# Patient Record
Sex: Male | Born: 1940 | Race: White | Hispanic: No | Marital: Married | Smoking: Former smoker
Health system: Southern US, Community
[De-identification: ages and names within clinical notes are randomized; demographics above are authoritative.]

## PROBLEM LIST (undated history)

## (undated) DIAGNOSIS — H43399 Other vitreous opacities, unspecified eye: Secondary | ICD-10-CM

## (undated) DIAGNOSIS — E785 Hyperlipidemia, unspecified: Secondary | ICD-10-CM

## (undated) DIAGNOSIS — M549 Dorsalgia, unspecified: Secondary | ICD-10-CM

## (undated) DIAGNOSIS — I10 Essential (primary) hypertension: Secondary | ICD-10-CM

## (undated) DIAGNOSIS — Z860100 Personal history of colon polyps, unspecified: Secondary | ICD-10-CM

## (undated) HISTORY — PX: OTHER SURGICAL HISTORY: SHX169

## (undated) HISTORY — PX: UPPER GASTROINTESTINAL ENDOSCOPY: SHX188

## (undated) HISTORY — PX: KNEE SURGERY: SHX244

## (undated) HISTORY — DX: Hyperlipidemia, unspecified: E78.5

## (undated) HISTORY — DX: Other vitreous opacities, unspecified eye: H43.399

## (undated) HISTORY — PX: SIGMOIDOSCOPY: SUR1295

## (undated) HISTORY — PX: COLONOSCOPY: SHX174

## (undated) HISTORY — PX: KNEE CARTILAGE SURGERY: SHX688

## (undated) HISTORY — DX: Dorsalgia, unspecified: M54.9

## (undated) HISTORY — DX: Essential (primary) hypertension: I10

## (undated) HISTORY — DX: Personal history of colon polyps, unspecified: Z86.0100

## (undated) HISTORY — PX: TONSILLECTOMY AND ADENOIDECTOMY: SHX28

## (undated) HISTORY — PX: CHOLECYSTECTOMY: SHX55

## (undated) HISTORY — PX: LUMBAR DISC SURGERY: SHX700

## (undated) HISTORY — DX: Personal history of colonic polyps: Z86.010

---

## 2004-09-11 ENCOUNTER — Ambulatory Visit: Payer: Self-pay | Admitting: Internal Medicine

## 2004-10-23 ENCOUNTER — Ambulatory Visit: Payer: Self-pay | Admitting: Internal Medicine

## 2004-10-31 ENCOUNTER — Ambulatory Visit: Payer: Self-pay | Admitting: Internal Medicine

## 2004-12-29 ENCOUNTER — Ambulatory Visit: Payer: Self-pay | Admitting: Internal Medicine

## 2005-02-26 ENCOUNTER — Ambulatory Visit: Payer: Self-pay | Admitting: Internal Medicine

## 2005-03-05 ENCOUNTER — Ambulatory Visit: Payer: Self-pay | Admitting: Internal Medicine

## 2005-07-13 ENCOUNTER — Ambulatory Visit: Payer: Self-pay | Admitting: Internal Medicine

## 2005-08-29 ENCOUNTER — Ambulatory Visit: Payer: Self-pay | Admitting: Internal Medicine

## 2005-09-12 ENCOUNTER — Ambulatory Visit: Payer: Self-pay | Admitting: Internal Medicine

## 2006-02-26 ENCOUNTER — Ambulatory Visit: Payer: Self-pay | Admitting: Internal Medicine

## 2006-02-26 LAB — CONVERTED CEMR LAB
ALT: 18 units/L (ref 0–40)
AST: 18 units/L (ref 0–37)
Albumin: 3.5 g/dL (ref 3.5–5.2)
Alkaline Phosphatase: 55 units/L (ref 39–117)
BUN: 15 mg/dL (ref 6–23)
Basophils Absolute: 0 10*3/uL (ref 0.0–0.1)
Basophils Relative: 0.5 % (ref 0.0–1.0)
CO2: 29 meq/L (ref 19–32)
Calcium: 9 mg/dL (ref 8.4–10.5)
Chloride: 112 meq/L (ref 96–112)
Chol/HDL Ratio, serum: 3.3
Cholesterol: 155 mg/dL (ref 0–200)
Creatinine, Ser: 0.9 mg/dL (ref 0.4–1.5)
Eosinophil percent: 2.4 % (ref 0.0–5.0)
GFR calc non Af Amer: 89 mL/min
Glomerular Filtration Rate, Af Am: 108 mL/min/{1.73_m2}
Glucose, Bld: 107 mg/dL — ABNORMAL HIGH (ref 70–99)
HCT: 42.2 % (ref 39.0–52.0)
HDL: 46.8 mg/dL (ref >39.0)
Hemoglobin: 13.8 g/dL (ref 13.0–17.0)
LDL Cholesterol: 95 mg/dL (ref 0–99)
Lymphocytes Relative: 23.3 % (ref 12.0–46.0)
MCHC: 32.8 g/dL (ref 30.0–36.0)
MCV: 85 fL (ref 78.0–100.0)
Monocytes Absolute: 0.4 10*3/uL (ref 0.2–0.7)
Monocytes Relative: 6.9 % (ref 3.0–11.0)
Neutro Abs: 4.2 10*3/uL (ref 1.4–7.7)
Neutrophils Relative %: 66.9 % (ref 43.0–77.0)
PSA: 1.15 ng/mL (ref 0.10–4.00)
Platelets: 175 10*3/uL (ref 150–400)
Potassium: 4.3 meq/L (ref 3.5–5.1)
RBC: 4.97 M/uL (ref 4.22–5.81)
RDW: 12.3 % (ref 11.5–14.6)
Sodium: 145 meq/L (ref 135–145)
TSH: 1.13 microintl units/mL (ref 0.35–5.50)
Total Bilirubin: 0.8 mg/dL (ref 0.3–1.2)
Total Protein: 6.3 g/dL (ref 6.0–8.3)
Triglyceride fasting, serum: 66 mg/dL (ref 0–149)
VLDL: 13 mg/dL (ref 0–40)
WBC: 6.1 10*3/uL (ref 4.5–10.5)

## 2006-03-04 ENCOUNTER — Ambulatory Visit: Payer: Self-pay | Admitting: Internal Medicine

## 2006-03-12 ENCOUNTER — Ambulatory Visit: Payer: Self-pay | Admitting: Internal Medicine

## 2006-03-24 ENCOUNTER — Ambulatory Visit: Payer: Self-pay | Admitting: Internal Medicine

## 2006-03-24 ENCOUNTER — Encounter: Payer: Self-pay | Admitting: Internal Medicine

## 2006-04-23 ENCOUNTER — Encounter: Payer: Self-pay | Admitting: Internal Medicine

## 2006-04-26 LAB — HM COLONOSCOPY

## 2006-04-29 ENCOUNTER — Ambulatory Visit: Payer: Self-pay | Admitting: Internal Medicine

## 2006-05-05 ENCOUNTER — Encounter (INDEPENDENT_AMBULATORY_CARE_PROVIDER_SITE_OTHER): Payer: Self-pay | Admitting: Specialist

## 2006-05-05 ENCOUNTER — Ambulatory Visit: Payer: Self-pay | Admitting: Internal Medicine

## 2006-05-05 ENCOUNTER — Encounter: Payer: Self-pay | Admitting: Internal Medicine

## 2006-05-15 ENCOUNTER — Ambulatory Visit: Payer: Self-pay | Admitting: Internal Medicine

## 2006-06-04 ENCOUNTER — Ambulatory Visit: Payer: Self-pay | Admitting: Internal Medicine

## 2006-06-10 ENCOUNTER — Encounter: Admission: RE | Admit: 2006-06-10 | Discharge: 2006-06-10 | Payer: Self-pay | Admitting: Internal Medicine

## 2006-06-19 ENCOUNTER — Ambulatory Visit: Payer: Self-pay | Admitting: Internal Medicine

## 2006-09-02 ENCOUNTER — Ambulatory Visit: Payer: Self-pay | Admitting: Internal Medicine

## 2006-09-02 LAB — CONVERTED CEMR LAB
ALT: 13 units/L (ref 0–53)
AST: 19 units/L (ref 0–37)
Albumin: 3.6 g/dL (ref 3.5–5.2)
Alkaline Phosphatase: 54 units/L (ref 39–117)
BUN: 15 mg/dL (ref 6–23)
Bilirubin, Direct: 0.1 mg/dL (ref 0.0–0.3)
CO2: 29 meq/L (ref 19–32)
Calcium: 9.1 mg/dL (ref 8.4–10.5)
Chloride: 111 meq/L (ref 96–112)
Cholesterol: 157 mg/dL (ref 0–200)
Creatinine, Ser: 0.9 mg/dL (ref 0.4–1.5)
GFR calc Af Amer: 108 mL/min
GFR calc non Af Amer: 89 mL/min
Glucose, Bld: 102 mg/dL — ABNORMAL HIGH (ref 70–99)
HDL: 52.4 mg/dL (ref >39.0)
Hgb A1c MFr Bld: 5.5 % (ref 4.6–6.0)
LDL Cholesterol: 89 mg/dL (ref 0–99)
Potassium: 4.8 meq/L (ref 3.5–5.1)
Sodium: 143 meq/L (ref 135–145)
Total Bilirubin: 0.9 mg/dL (ref 0.3–1.2)
Total CHOL/HDL Ratio: 3
Total Protein: 6.5 g/dL (ref 6.0–8.3)
Triglycerides: 76 mg/dL (ref 0–149)
VLDL: 15 mg/dL (ref 0–40)

## 2006-09-05 ENCOUNTER — Ambulatory Visit: Payer: Self-pay | Admitting: Internal Medicine

## 2006-12-08 ENCOUNTER — Telehealth: Payer: Self-pay | Admitting: Internal Medicine

## 2006-12-22 DIAGNOSIS — Z860100 Personal history of colon polyps, unspecified: Secondary | ICD-10-CM | POA: Insufficient documentation

## 2006-12-22 DIAGNOSIS — I10 Essential (primary) hypertension: Secondary | ICD-10-CM | POA: Insufficient documentation

## 2006-12-22 DIAGNOSIS — K219 Gastro-esophageal reflux disease without esophagitis: Secondary | ICD-10-CM

## 2006-12-22 DIAGNOSIS — E785 Hyperlipidemia, unspecified: Secondary | ICD-10-CM | POA: Insufficient documentation

## 2007-01-26 ENCOUNTER — Observation Stay (HOSPITAL_COMMUNITY): Admission: EM | Admit: 2007-01-26 | Discharge: 2007-01-27 | Payer: Self-pay | Admitting: Emergency Medicine

## 2007-01-26 ENCOUNTER — Ambulatory Visit: Payer: Self-pay | Admitting: Internal Medicine

## 2007-01-26 ENCOUNTER — Telehealth: Payer: Self-pay | Admitting: Internal Medicine

## 2007-01-27 ENCOUNTER — Telehealth (INDEPENDENT_AMBULATORY_CARE_PROVIDER_SITE_OTHER): Payer: Self-pay | Admitting: *Deleted

## 2007-02-04 ENCOUNTER — Ambulatory Visit: Payer: Self-pay

## 2007-02-04 ENCOUNTER — Encounter: Payer: Self-pay | Admitting: Internal Medicine

## 2007-02-10 ENCOUNTER — Telehealth: Payer: Self-pay | Admitting: Internal Medicine

## 2007-03-05 ENCOUNTER — Telehealth: Payer: Self-pay | Admitting: Internal Medicine

## 2007-03-10 ENCOUNTER — Ambulatory Visit: Payer: Self-pay | Admitting: Internal Medicine

## 2007-03-10 LAB — CONVERTED CEMR LAB
ALT: 22 units/L (ref 0–53)
AST: 18 units/L (ref 0–37)
Albumin: 3.7 g/dL (ref 3.5–5.2)
Alkaline Phosphatase: 62 units/L (ref 39–117)
BUN: 17 mg/dL (ref 6–23)
Basophils Relative: 0.6 % (ref 0.0–1.0)
Bilirubin Urine: NEGATIVE
Bilirubin, Direct: 0.2 mg/dL (ref 0.0–0.3)
Blood in Urine, dipstick: NEGATIVE
CO2: 28 meq/L (ref 19–32)
Calcium: 9.2 mg/dL (ref 8.4–10.5)
Chloride: 106 meq/L (ref 96–112)
GFR calc Af Amer: 96 mL/min
Hemoglobin: 15.1 g/dL (ref 13.0–17.0)
Ketones, urine, test strip: NEGATIVE
MCHC: 34.5 g/dL (ref 30.0–36.0)
Monocytes Relative: 9.1 % (ref 3.0–11.0)
Neutro Abs: 3.3 10*3/uL (ref 1.4–7.7)
Neutrophils Relative %: 56.5 % (ref 43.0–77.0)
Platelets: 162 10*3/uL (ref 150–400)
Protein, U semiquant: NEGATIVE
RBC: 5.03 M/uL (ref 4.22–5.81)
RDW: 12.4 % (ref 11.5–14.6)
Sodium: 142 meq/L (ref 135–145)
Specific Gravity, Urine: 1.025
Total Bilirubin: 0.9 mg/dL (ref 0.3–1.2)
Total CHOL/HDL Ratio: 3.8
Total Protein: 6.5 g/dL (ref 6.0–8.3)
VLDL: 14 mg/dL (ref 0–40)
WBC Urine, dipstick: NEGATIVE

## 2007-03-17 ENCOUNTER — Ambulatory Visit: Payer: Self-pay | Admitting: Internal Medicine

## 2007-09-09 ENCOUNTER — Ambulatory Visit: Payer: Self-pay | Admitting: Internal Medicine

## 2007-09-09 LAB — CONVERTED CEMR LAB
Albumin: 3.9 g/dL (ref 3.5–5.2)
Bilirubin, Direct: 0.1 mg/dL (ref 0.0–0.3)
Calcium: 9.3 mg/dL (ref 8.4–10.5)
Chloride: 108 meq/L (ref 96–112)
Creatinine, Ser: 0.8 mg/dL (ref 0.4–1.5)
GFR calc non Af Amer: 102 mL/min
Sodium: 142 meq/L (ref 135–145)
Total CHOL/HDL Ratio: 3.5

## 2007-09-22 ENCOUNTER — Ambulatory Visit: Payer: Self-pay | Admitting: Internal Medicine

## 2008-03-18 ENCOUNTER — Ambulatory Visit: Payer: Self-pay | Admitting: Internal Medicine

## 2008-03-18 LAB — CONVERTED CEMR LAB
ALT: 23 units/L (ref 0–53)
AST: 20 units/L (ref 0–37)
Albumin: 3.9 g/dL (ref 3.5–5.2)
Alkaline Phosphatase: 53 units/L (ref 39–117)
BUN: 21 mg/dL (ref 6–23)
Basophils Absolute: 0 10*3/uL (ref 0.0–0.1)
Bilirubin Urine: NEGATIVE
Blood in Urine, dipstick: NEGATIVE
CO2: 29 meq/L (ref 19–32)
Calcium: 9.4 mg/dL (ref 8.4–10.5)
Cholesterol: 169 mg/dL (ref 0–200)
Eosinophils Absolute: 0.1 10*3/uL (ref 0.0–0.7)
HCT: 44.8 % (ref 39.0–52.0)
LDL Cholesterol: 102 mg/dL — ABNORMAL HIGH (ref 0–99)
Lymphocytes Relative: 26 % (ref 12.0–46.0)
MCHC: 34.1 g/dL (ref 30.0–36.0)
MCV: 87.4 fL (ref 78.0–100.0)
Neutro Abs: 4.4 10*3/uL (ref 1.4–7.7)
Nitrite: NEGATIVE
Platelets: 168 10*3/uL (ref 150–400)
Potassium: 4.6 meq/L (ref 3.5–5.1)
RBC: 5.13 M/uL (ref 4.22–5.81)
Sodium: 141 meq/L (ref 135–145)
Specific Gravity, Urine: 1.02
Triglycerides: 94 mg/dL (ref 0–149)
Urobilinogen, UA: 0.2
VLDL: 19 mg/dL (ref 0–40)
WBC Urine, dipstick: NEGATIVE

## 2008-03-24 ENCOUNTER — Ambulatory Visit: Payer: Self-pay | Admitting: Internal Medicine

## 2008-05-13 ENCOUNTER — Ambulatory Visit (HOSPITAL_COMMUNITY): Admission: RE | Admit: 2008-05-13 | Discharge: 2008-05-13 | Payer: Self-pay | Admitting: Orthopaedic Surgery

## 2008-06-21 ENCOUNTER — Encounter: Payer: Self-pay | Admitting: Endocrinology

## 2008-09-15 ENCOUNTER — Ambulatory Visit: Payer: Self-pay | Admitting: Internal Medicine

## 2008-09-15 LAB — CONVERTED CEMR LAB
ALT: 15 units/L (ref 0–53)
AST: 18 units/L (ref 0–37)
Bilirubin, Direct: 0 mg/dL (ref 0.0–0.3)
Cholesterol: 154 mg/dL (ref 0–200)
Creatinine, Ser: 1 mg/dL (ref 0.4–1.5)
GFR calc non Af Amer: 78.47 mL/min (ref >60)
Glucose, Bld: 110 mg/dL — ABNORMAL HIGH (ref 70–99)
LDL Cholesterol: 88 mg/dL (ref 0–99)
Potassium: 4.2 meq/L (ref 3.5–5.1)
Total CHOL/HDL Ratio: 3
Total Protein: 6.7 g/dL (ref 6.0–8.3)
VLDL: 14.6 mg/dL (ref 0.0–40.0)

## 2008-09-22 ENCOUNTER — Ambulatory Visit: Payer: Self-pay | Admitting: Internal Medicine

## 2009-02-13 ENCOUNTER — Ambulatory Visit: Payer: Self-pay | Admitting: Internal Medicine

## 2009-02-15 IMAGING — CT CT ANGIO CHEST
1 of 4 series · 19 of 36 positions shown · IV contrast (APPLIED)
Comparison: Chest radiograph same day.

CLINICAL DATA: Short of breath.  Chest pain.  Assess for pulmonary emboli.  
 CT ANGIOGRAPHY OF CHEST:
TECHNIQUE: Multidetector CT imaging of the chest was performed during bolus injection of intravenous contrast.  Multiplanar CT angiographic image reconstructions were generated to evaluate the vascular anatomy.
 Contrast:  100 cc Omnipaque 300

[Series 6: pe 1.0 b40f thins for pacs · axial · 0.68mm/px · z∈[-258,-20]mm · 19 of 266 slices shown]
[im 14/266  lung]
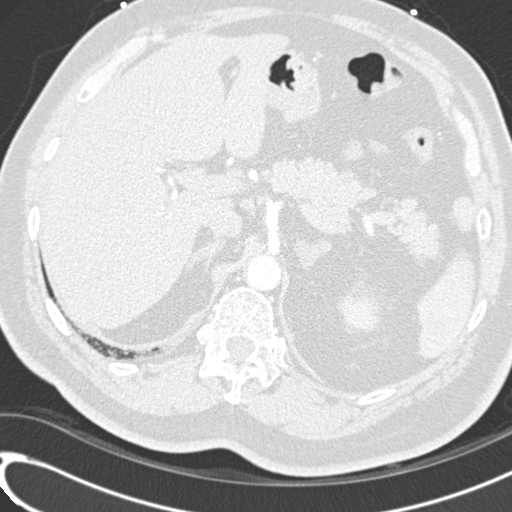
[im 27/266  mediastinal]
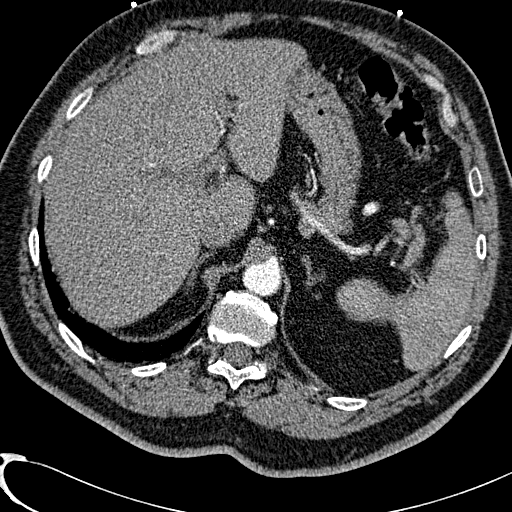
[im 40/266  lung]
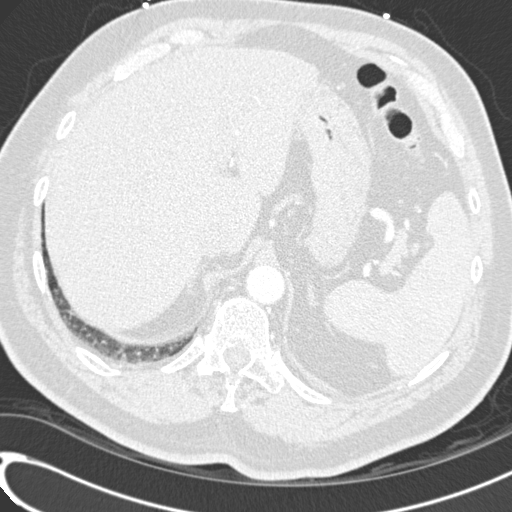
[im 54/266  mediastinal]
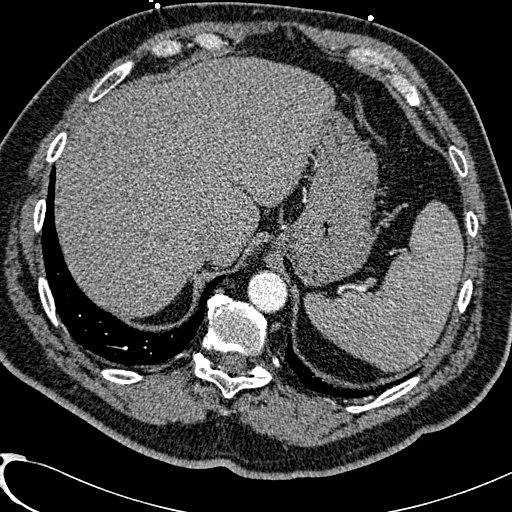
[im 67/266  lung]
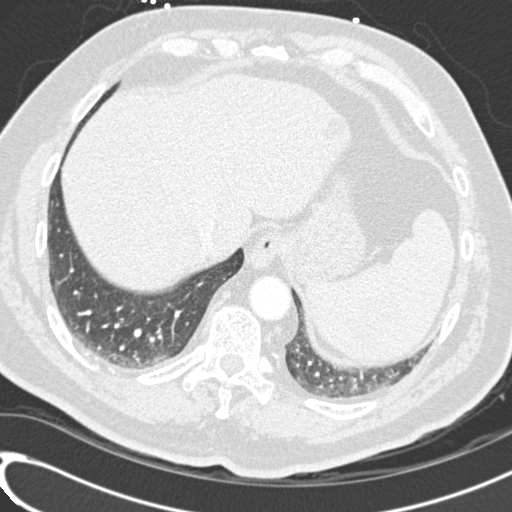
[im 80/266  mediastinal]
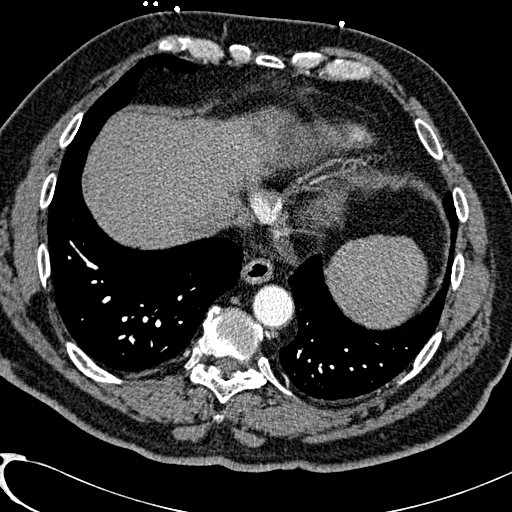
[im 93/266  lung]
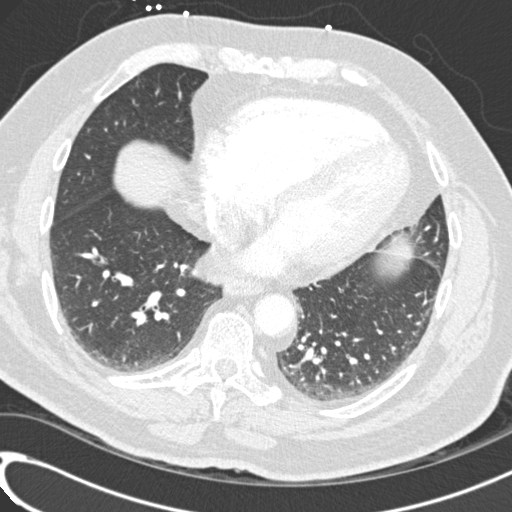
[im 107/266  mediastinal]
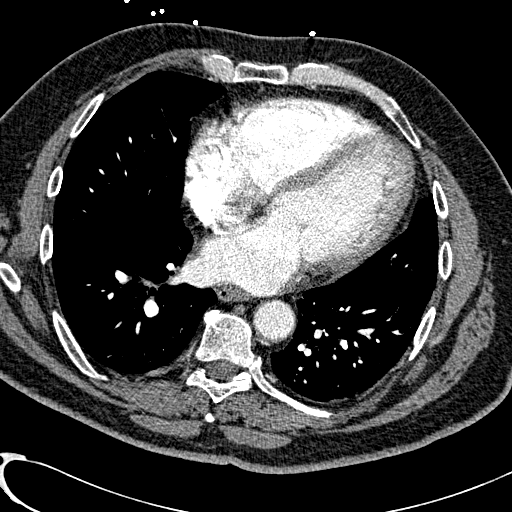
[im 120/266  lung]
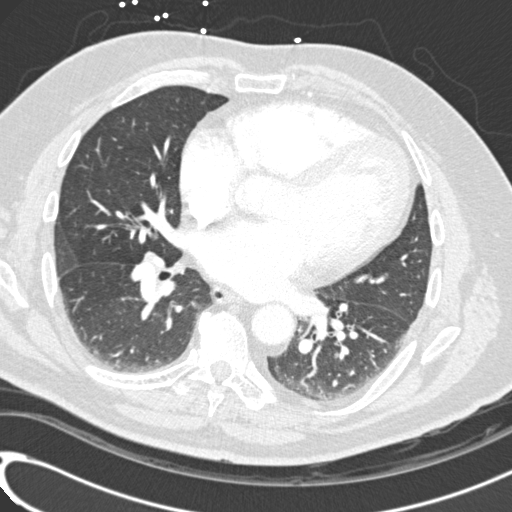
[im 133/266  mediastinal]
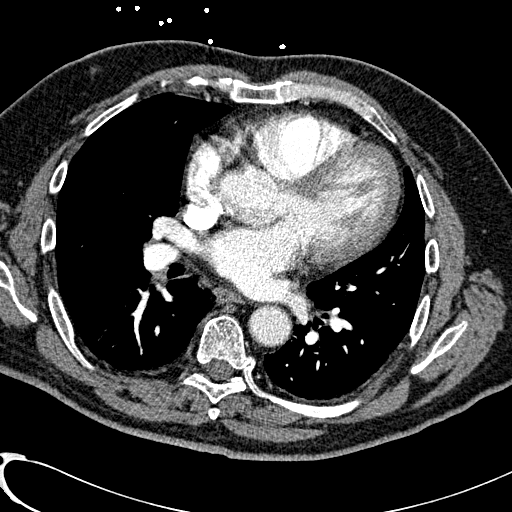
[im 146/266  lung]
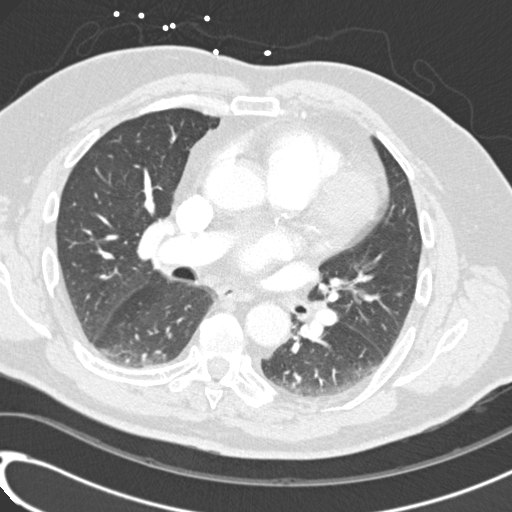
[im 160/266  mediastinal]
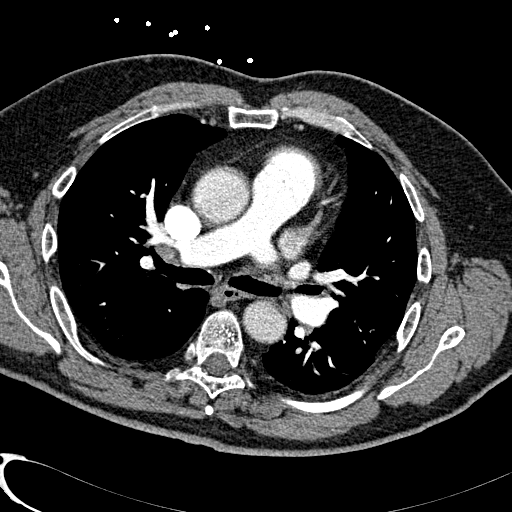
[im 173/266  lung]
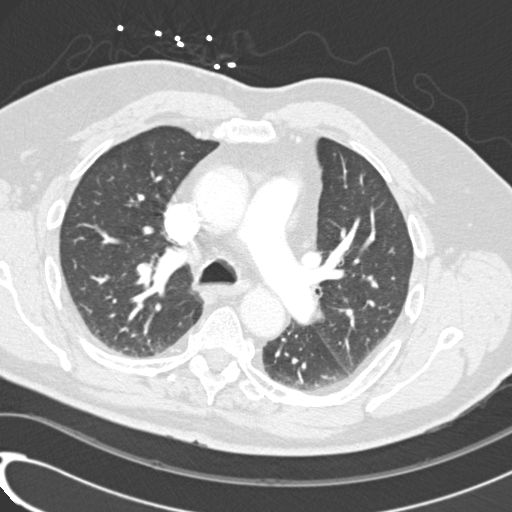
[im 186/266  mediastinal]
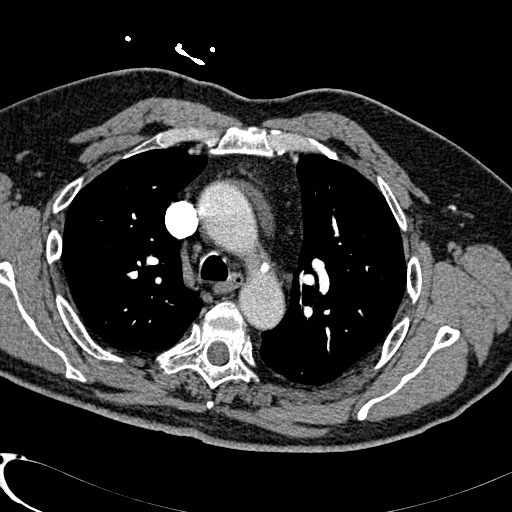
[im 199/266  lung]
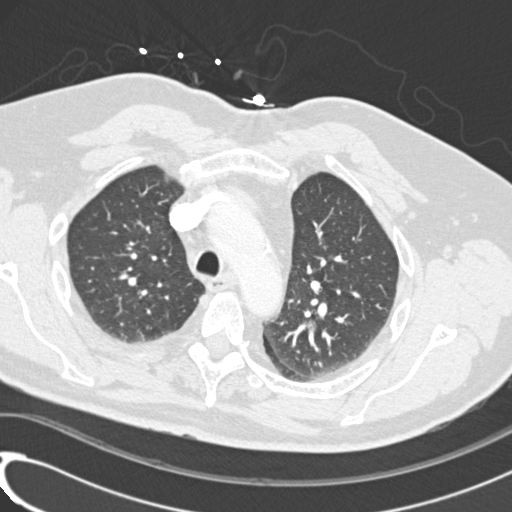
[im 213/266  mediastinal]
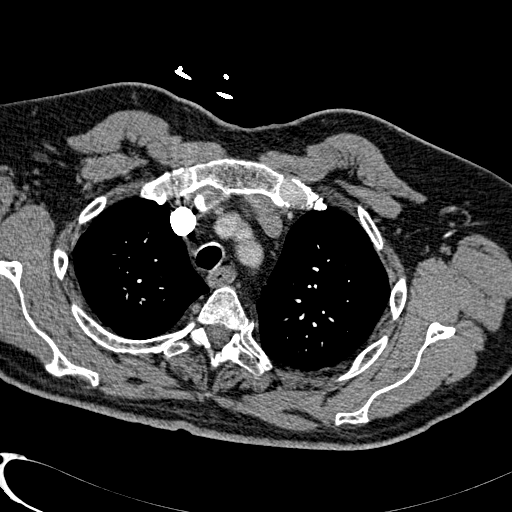
[im 226/266  lung]
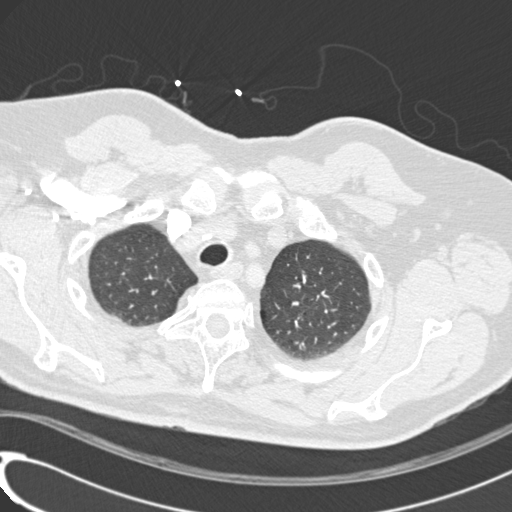
[im 239/266  mediastinal]
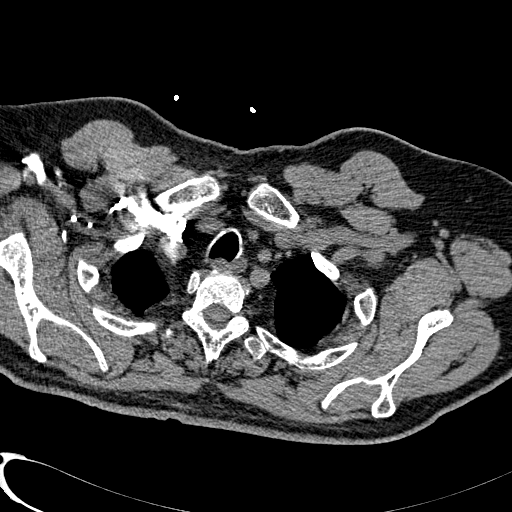
[im 252/266  lung]
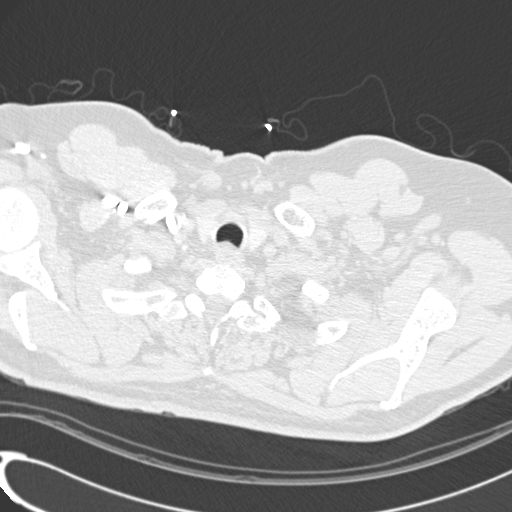

[19 of 36 positions shown; findings below may reference images not displayed]

FINDINGS: There are no pulmonary emboli.  There is atherosclerosis of the aorta but no sign of dissection.  There is no pleural or pericardial fluid.  The lung parenchyma is clear.  No mediastinal or hilar adenopathy.  Scans in the upper abdomen show two 1 cm nonspecific hypodense foci in the lateral segment of the left lobe of the liver.  These are not completely evaluated and could be benign or malignant.  Statistically more likely the former.  No significant spinal lesion is evident.
IMPRESSION: 1.   No pulmonary emboli. 
 2.  No active pulmonary pathology evident. 
 3.  Two low densities in the left lobe of the liver not completely evaluated.  These are nonspecific and are statistically likely to represent benign entities but metastatic disease to the liver cannot be excluded though there is no strong likelihood of that.

## 2009-02-17 ENCOUNTER — Emergency Department (HOSPITAL_COMMUNITY): Admission: EM | Admit: 2009-02-17 | Discharge: 2009-02-17 | Payer: Self-pay | Admitting: Family Medicine

## 2009-03-09 ENCOUNTER — Encounter (INDEPENDENT_AMBULATORY_CARE_PROVIDER_SITE_OTHER): Payer: Self-pay | Admitting: Orthopaedic Surgery

## 2009-03-09 ENCOUNTER — Ambulatory Visit: Payer: Self-pay | Admitting: Surgery

## 2009-03-09 ENCOUNTER — Ambulatory Visit: Admission: RE | Admit: 2009-03-09 | Discharge: 2009-03-09 | Payer: Self-pay | Admitting: Orthopaedic Surgery

## 2009-03-09 ENCOUNTER — Ambulatory Visit: Payer: Self-pay | Admitting: Internal Medicine

## 2009-03-09 LAB — CONVERTED CEMR LAB
Albumin: 3.8 g/dL (ref 3.5–5.2)
Alkaline Phosphatase: 58 units/L (ref 39–117)
Bilirubin, Direct: 0.1 mg/dL (ref 0.0–0.3)
Chloride: 103 meq/L (ref 96–112)
Glucose, Bld: 101 mg/dL — ABNORMAL HIGH (ref 70–99)
Total CHOL/HDL Ratio: 3
Total Protein: 7 g/dL (ref 6.0–8.3)
VLDL: 13.8 mg/dL (ref 0.0–40.0)

## 2009-03-20 ENCOUNTER — Ambulatory Visit: Payer: Self-pay | Admitting: Internal Medicine

## 2009-04-24 ENCOUNTER — Ambulatory Visit: Payer: Self-pay | Admitting: Family Medicine

## 2009-08-30 ENCOUNTER — Telehealth: Payer: Self-pay | Admitting: Internal Medicine

## 2009-09-11 ENCOUNTER — Ambulatory Visit: Payer: Self-pay | Admitting: Internal Medicine

## 2009-09-11 LAB — CONVERTED CEMR LAB
Albumin: 3.9 g/dL (ref 3.5–5.2)
Alkaline Phosphatase: 54 units/L (ref 39–117)
Bilirubin, Direct: 0.1 mg/dL (ref 0.0–0.3)
CO2: 30 meq/L (ref 19–32)
Calcium: 8.8 mg/dL (ref 8.4–10.5)
Cholesterol: 175 mg/dL (ref 0–200)
Creatinine, Ser: 0.9 mg/dL (ref 0.4–1.5)
GFR calc non Af Amer: 93.12 mL/min (ref >60)
LDL Cholesterol: 100 mg/dL — ABNORMAL HIGH (ref 0–99)
Sodium: 144 meq/L (ref 135–145)
Total CHOL/HDL Ratio: 3
VLDL: 17.8 mg/dL (ref 0.0–40.0)

## 2009-09-18 ENCOUNTER — Ambulatory Visit: Payer: Self-pay | Admitting: Internal Medicine

## 2009-10-20 ENCOUNTER — Encounter: Payer: Self-pay | Admitting: Internal Medicine

## 2009-11-16 ENCOUNTER — Ambulatory Visit: Payer: Self-pay | Admitting: Internal Medicine

## 2009-12-16 ENCOUNTER — Emergency Department (HOSPITAL_COMMUNITY): Admission: EM | Admit: 2009-12-16 | Discharge: 2009-12-16 | Payer: Self-pay | Admitting: Family Medicine

## 2010-02-25 HISTORY — PX: CATARACT EXTRACTION, BILATERAL: SHX1313

## 2010-03-12 ENCOUNTER — Ambulatory Visit
Admission: RE | Admit: 2010-03-12 | Discharge: 2010-03-12 | Payer: Self-pay | Source: Home / Self Care | Attending: Internal Medicine | Admitting: Internal Medicine

## 2010-03-12 ENCOUNTER — Other Ambulatory Visit: Payer: Self-pay | Admitting: Internal Medicine

## 2010-03-12 LAB — BASIC METABOLIC PANEL
BUN: 19 mg/dL (ref 6–23)
CO2: 25 mEq/L (ref 19–32)
Calcium: 8.8 mg/dL (ref 8.4–10.5)
Chloride: 104 mEq/L (ref 96–112)
Creatinine, Ser: 0.9 mg/dL (ref 0.4–1.5)
GFR: 94.25 mL/min (ref >60.00)
Glucose, Bld: 99 mg/dL (ref 70–99)
Potassium: 4.2 mEq/L (ref 3.5–5.1)
Sodium: 137 mEq/L (ref 135–145)

## 2010-03-12 LAB — HEPATIC FUNCTION PANEL
ALT: 18 U/L (ref 0–53)
AST: 20 U/L (ref 0–37)
Albumin: 3.9 g/dL (ref 3.5–5.2)
Alkaline Phosphatase: 57 U/L (ref 39–117)
Bilirubin, Direct: 0.1 mg/dL (ref 0.0–0.3)
Total Bilirubin: 0.8 mg/dL (ref 0.3–1.2)
Total Protein: 6.6 g/dL (ref 6.0–8.3)

## 2010-03-12 LAB — LIPID PANEL
Cholesterol: 158 mg/dL (ref 0–200)
HDL: 53.9 mg/dL (ref >39.00)
LDL Cholesterol: 86 mg/dL (ref 0–99)
Total CHOL/HDL Ratio: 3
Triglycerides: 92 mg/dL (ref 0.0–149.0)
VLDL: 18.4 mg/dL (ref 0.0–40.0)

## 2010-03-12 LAB — CBC WITH DIFFERENTIAL/PLATELET
Basophils Absolute: 0 10*3/uL (ref 0.0–0.1)
Basophils Relative: 0.2 % (ref 0.0–3.0)
Eosinophils Absolute: 0.2 10*3/uL (ref 0.0–0.7)
Eosinophils Relative: 2.8 % (ref 0.0–5.0)
HCT: 43.7 % (ref 39.0–52.0)
Hemoglobin: 14.7 g/dL (ref 13.0–17.0)
Lymphocytes Relative: 27.4 % (ref 12.0–46.0)
Lymphs Abs: 1.6 10*3/uL (ref 0.7–4.0)
MCHC: 33.7 g/dL (ref 30.0–36.0)
MCV: 90 fl (ref 78.0–100.0)
Monocytes Absolute: 0.5 10*3/uL (ref 0.1–1.0)
Monocytes Relative: 8 % (ref 3.0–12.0)
Neutro Abs: 3.6 10*3/uL (ref 1.4–7.7)
Neutrophils Relative %: 61.6 % (ref 43.0–77.0)
Platelets: 165 10*3/uL (ref 150.0–400.0)
RBC: 4.86 Mil/uL (ref 4.22–5.81)
RDW: 12.9 % (ref 11.5–14.6)
WBC: 5.8 10*3/uL (ref 4.5–10.5)

## 2010-03-12 LAB — CONVERTED CEMR LAB
Nitrite: NEGATIVE
Specific Gravity, Urine: 1.025
Urobilinogen, UA: 0.2
WBC Urine, dipstick: NEGATIVE

## 2010-03-12 LAB — PSA: PSA: 1.09 ng/mL (ref 0.10–4.00)

## 2010-03-12 LAB — TSH: TSH: 1.68 u[IU]/mL (ref 0.35–5.50)

## 2010-03-19 ENCOUNTER — Ambulatory Visit
Admission: RE | Admit: 2010-03-19 | Discharge: 2010-03-19 | Payer: Self-pay | Source: Home / Self Care | Attending: Internal Medicine | Admitting: Internal Medicine

## 2010-03-27 NOTE — Miscellaneous (Signed)
Summary: Living Will  Living Will   Imported By: Lanelle Bal 11/02/2009 11:52:38  _____________________________________________________________________  External Attachment:    Type:   Image     Comment:   External Document

## 2010-03-27 NOTE — Miscellaneous (Signed)
Summary: Health Care Power of [employer] Power of Attorney   Imported By: Lanelle Bal 11/02/2009 11:51:13  _____________________________________________________________________  External Attachment:    Type:   Image     Comment:   External Document

## 2010-03-27 NOTE — Assessment & Plan Note (Signed)
Summary: 6 month rov/njr/pt rsc from bmp/cjr   Vital Signs:  Patient profile:   70 year old male Weight:      241 pounds Temp:     98 degrees F Pulse rate:   56 / minute Resp:     14 per minute BP sitting:   132 / 70  (left arm)  Vitals Entered By: Gladis Riffle, RN (March 20, 2009 11:32 AM)   History of Present Illness:  Follow-Up Visit      This is a 70 year old man who presents for Follow-up visit.  The patient denies chest pain, palpitations, dizziness, syncope, edema, SOB, DOE, PND, and orthopnea.  Since the last visit the patient notes no new problems or concerns and a recent ED visit.  The patient reports taking meds as prescribed.  When questioned about possible medication side effects, the patient notes none.   Went to ED---abdominal pain---treated diverticulitis. Sxs resolved  All other systems reviewed and were negative   Preventive Screening-Counseling & Management  Alcohol-Tobacco     Smoking Status: quit > 6 months     Year Started: 1955     Year Quit: 1995  Current Problems (verified): 1)  Colonic Polyps, Hx of  (ICD-V12.72) 2)  Hypertension  (ICD-401.9) 3)  Hyperlipidemia  (ICD-272.4) 4)  Gerd  (ICD-530.81)  Current Medications (verified): 1)  Atenolol 100 Mg Tabs (Atenolol) .... Take 1/2 Tablet By Mouth Twice A Day 2)  Doxazosin Mesylate 2 Mg Tabs (Doxazosin Mesylate) .... Take 1/2 Tablet By Mouth Two Times A Day 3)  Lovastatin 40 Mg Tabs (Lovastatin) .... Take 1 Tablet By Mouth Once A Day 4)  Omeprazole 20 Mg Cpdr (Omeprazole) .... Take 1 Tablet By Mouth Once A Day 5)  Quinapril Hcl 40 Mg Tabs (Quinapril Hcl) .... Take 1 Tablet By Mouth Once A Day 6)  Icaps Plus   Tabs (Multiple Vitamins-Minerals) .... Two Times A Day 7)  Oxycodone-Acetaminophen 5-325 Mg  Tabs (Oxycodone-Acetaminophen) .... Take 1 Tablet By Mouth Two Times A Day As Needed Back Pain 8)  Centrum Silver   Tabs (Multiple Vitamins-Minerals) .... 2 Tabs Once Daily 9)  Caltrate 600+d 600-400  Mg-Unit  Tabs (Calcium Carbonate-Vitamin D) .... Once Daily 10)  Voltaren 1 %  Gel (Diclofenac Sodium) .... As Needed  Allergies: 1)  ! Asa 2)  ! * Small Pox Vaccine  Comments:  Nurse/Medical Assistant: 6 month rov, labs done  The patient's medications and allergies were reviewed with the patient and were updated in the Medication and Allergy Lists. Gladis Riffle, RN (March 20, 2009 11:33 AM)  Past History:  Past Medical History: Last updated: 03/24/2008 Hyperglycemia GERD Hyperlipidemia Hypertension Arthritis Ulcers Heart Murmur Colonic polyps, hx of chronic back pain---followed by dr Vear Clock  Past Surgical History: Last updated: 12/22/2006 R Knee Surgery Lumbar Discectomy Appendectomy Cholecystectomy Tonsillectomy Panendoscopy  Family History: Last updated: 12/22/2006 Family History of Alcoholism/Addiction Family History Breast cancer 1st degree relative <50 Family History Diabetes 1st degree relative Family History Lung cancer Family History of Stroke M 1st degree relative <50  Social History: Last updated: 12/22/2006 Retired Married Former Smoker Alcohol use-no Drug use-no Regular exercise-no  Risk Factors: Exercise: no (12/22/2006)  Risk Factors: Smoking Status: quit > 6 months (03/20/2009)  Review of Systems       All other systems reviewed and were negative   Physical Exam  General:  Well-developed,well-nourished,in no acute distress; alert,appropriate and cooperative throughout examination Head:  normocephalic and atraumatic.   Eyes:  pupils equal and pupils round.   Ears:  R ear normal and L ear normal.   Nose:  no external deformity and no external erythema.   Neck:  No deformities, masses, or tenderness noted. Chest Wall:  No deformities, masses, tenderness or gynecomastia noted. Lungs:  Normal respiratory effort, chest expands symmetrically. Lungs are clear to auscultation, no crackles or wheezes. Heart:  Normal rate and regular  rhythm. S1 and S2 normal without gallop, murmur, click, rub or other extra sounds. Abdomen:  Bowel sounds positive,abdomen soft and non-tender without masses, organomegaly or hernias noted.  overweight Msk:  No deformity or scoliosis noted of thoracic or lumbar spine.   Pulses:  R radial normal and L radial normal.   Neurologic:  cranial nerves II-XII intact and gait normal.   Skin:  turgor normal and color normal.     Impression & Recommendations:  Problem # 1:  HYPERTENSION (ICD-401.9) fair control continue current medications  His updated medication list for this problem includes:    Atenolol 100 Mg Tabs (Atenolol) .Marland Kitchen... Take 1/2 tablet by mouth twice a day    Doxazosin Mesylate 2 Mg Tabs (Doxazosin mesylate) .Marland Kitchen... Take 1/2 tablet by mouth two times a day    Quinapril Hcl 40 Mg Tabs (Quinapril hcl) .Marland Kitchen... Take 1 tablet by mouth once a day  BP today: 132/70 Prior BP: 124/62 (02/13/2009)  Labs Reviewed: K+: 4.7 (03/09/2009) Creat: : 0.9 (03/09/2009)   Chol: 173 (03/09/2009)   HDL: 58.90 (03/09/2009)   LDL: 100 (03/09/2009)   TG: 69.0 (03/09/2009)  Problem # 2:  HYPERLIPIDEMIA (ICD-272.4) well controlled continue current medications  His updated medication list for this problem includes:    Lovastatin 40 Mg Tabs (Lovastatin) .Marland Kitchen... Take 1 tablet by mouth once a day  Labs Reviewed: SGOT: 18 (03/09/2009)   SGPT: 19 (03/09/2009)   HDL:58.90 (03/09/2009), 51.80 (09/15/2008)  LDL:100 (03/09/2009), 88 (16/11/9602)  Chol:173 (03/09/2009), 154 (09/15/2008)  Trig:69.0 (03/09/2009), 73.0 (09/15/2008)  Problem # 3:  GERD (ICD-530.81) controlled continue current medications  His updated medication list for this problem includes:    Omeprazole 20 Mg Cpdr (Omeprazole) .Marland Kitchen... Take 1 tablet by mouth once a day  Complete Medication List: 1)  Atenolol 100 Mg Tabs (Atenolol) .... Take 1/2 tablet by mouth twice a day 2)  Doxazosin Mesylate 2 Mg Tabs (Doxazosin mesylate) .... Take 1/2 tablet by  mouth two times a day 3)  Lovastatin 40 Mg Tabs (Lovastatin) .... Take 1 tablet by mouth once a day 4)  Omeprazole 20 Mg Cpdr (Omeprazole) .... Take 1 tablet by mouth once a day 5)  Quinapril Hcl 40 Mg Tabs (Quinapril hcl) .... Take 1 tablet by mouth once a day 6)  Icaps Plus Tabs (Multiple vitamins-minerals) .... Two times a day 7)  Oxycodone-acetaminophen 5-325 Mg Tabs (Oxycodone-acetaminophen) .... Take 1 tablet by mouth two times a day as needed back pain 8)  Centrum Silver Tabs (Multiple vitamins-minerals) .... 2 tabs once daily 9)  Caltrate 600+d 600-400 Mg-unit Tabs (Calcium carbonate-vitamin d) .... Once daily 10)  Voltaren 1 % Gel (Diclofenac sodium) .... As needed  Patient Instructions: 1)  Please schedule a follow-up appointment in 6 months.

## 2010-03-27 NOTE — Assessment & Plan Note (Signed)
Summary: 6 mo rov/mm   Vital Signs:  Patient profile:   70 year old male Height:      69.5 inches Weight:      240 pounds BMI:     35.06 Temp:     98.6 degrees F oral Pulse rate:   64 / minute BP sitting:   140 / 78  (left arm) Cuff size:   regular  Vitals Entered By: Kern Reap CMA Duncan Dull) (September 18, 2009 7:55 AM) CC: follow-up visit Is Patient Diabetic? No Pain Assessment Patient in pain? no        CC:  follow-up visit.  History of Present Illness:  Follow-Up Visit      This is a 70 year old man who presents for Follow-up visit.  The patient denies chest pain and palpitations.  Since the last visit the patient notes no new problems or concerns.  The patient reports taking meds as prescribed and not monitoring blood sugars.  When questioned about possible medication side effects, the patient notes none.    All other systems reviewed and were negative   Current Problems (verified): 1)  Colonic Polyps, Hx of  (ICD-V12.72) 2)  Hypertension  (ICD-401.9) 3)  Hyperlipidemia  (ICD-272.4) 4)  Gerd  (ICD-530.81)  Current Medications (verified): 1)  Atenolol 100 Mg Tabs (Atenolol) .... Take 1/2 Tablet By Mouth Twice A Day 2)  Doxazosin Mesylate 2 Mg Tabs (Doxazosin Mesylate) .... Take 1/2 Tablet By Mouth Two Times A Day 3)  Lovastatin 40 Mg Tabs (Lovastatin) .... Take 1 Tablet By Mouth Once A Day 4)  Omeprazole 20 Mg Cpdr (Omeprazole) .... Take 1 Tablet By Mouth Once A Day 5)  Quinapril Hcl 40 Mg Tabs (Quinapril Hcl) .... Take 1 Tablet By Mouth Once A Day 6)  Icaps Plus   Tabs (Multiple Vitamins-Minerals) .... Two Times A Day 7)  Oxycodone-Acetaminophen 5-325 Mg  Tabs (Oxycodone-Acetaminophen) .... Take 1 Tablet By Mouth Two Times A Day As Needed Back Pain 8)  Centrum Silver   Tabs (Multiple Vitamins-Minerals) .... 2 Tabs Once Daily 9)  Caltrate 600+d 600-400 Mg-Unit  Tabs (Calcium Carbonate-Vitamin D) .... Once Daily 10)  Voltaren 1 %  Gel (Diclofenac Sodium) .... As  Needed  Allergies: 1)  ! Asa 2)  ! * Small Pox Vaccine  Past History:  Past Medical History: Last updated: 03/24/2008 Hyperglycemia GERD Hyperlipidemia Hypertension Arthritis Ulcers Heart Murmur Colonic polyps, hx of chronic back pain---followed by dr Vear Clock  Past Surgical History: Last updated: 12/22/2006 R Knee Surgery Lumbar Discectomy Appendectomy Cholecystectomy Tonsillectomy Panendoscopy  Family History: Last updated: 12/22/2006 Family History of Alcoholism/Addiction Family History Breast cancer 1st degree relative <50 Family History Diabetes 1st degree relative Family History Lung cancer Family History of Stroke M 1st degree relative <50  Social History: Last updated: 12/22/2006 Retired Married Former Smoker Alcohol use-no Drug use-no Regular exercise-no  Risk Factors: Exercise: no (12/22/2006)  Risk Factors: Smoking Status: quit > 6 months (03/20/2009)  Physical Exam  General:  Well-developed,well-nourished,in no acute distress; alert,appropriate and cooperative throughout examination Head:  normocephalic and atraumatic.   Eyes:  pupils equal and pupils round.   Ears:  R ear normal and L ear normal.   Neck:  No deformities, masses, or tenderness noted. Chest Wall:  No deformities, masses, tenderness or gynecomastia noted. Lungs:  Normal respiratory effort, chest expands symmetrically. Lungs are clear to auscultation, no crackles or wheezes. Heart:  normal rate and regular rhythm.   Abdomen:  soft and non-tender.  Msk:  No deformity or scoliosis noted of thoracic or lumbar spine.   Neurologic:  cranial nerves II-XII intact and gait normal.     Impression & Recommendations:  Problem # 1:  HYPERTENSION (ICD-401.9) reasonable bp t home (120-130/60s_ His updated medication list for this problem includes:    Atenolol 100 Mg Tabs (Atenolol) .Marland Kitchen... Take 1/2 tablet by mouth twice a day    Doxazosin Mesylate 2 Mg Tabs (Doxazosin mesylate) .Marland Kitchen...  Take 1/2 tablet by mouth two times a day    Quinapril Hcl 40 Mg Tabs (Quinapril hcl) .Marland Kitchen... Take 1 tablet by mouth once a day  BP today: 140/78 Prior BP: 130/80 (04/24/2009)  Labs Reviewed: K+: 4.7 (09/11/2009) Creat: : 0.9 (09/11/2009)   Chol: 175 (09/11/2009)   HDL: 57.30 (09/11/2009)   LDL: 100 (09/11/2009)   TG: 89.0 (09/11/2009)  Problem # 2:  HYPERLIPIDEMIA (ICD-272.4) controlled continue current medications  His updated medication list for this problem includes:    Lovastatin 40 Mg Tabs (Lovastatin) .Marland Kitchen... Take 1 tablet by mouth once a day  Labs Reviewed: SGOT: 21 (09/11/2009)   SGPT: 21 (09/11/2009)   HDL:57.30 (09/11/2009), 58.90 (03/09/2009)  LDL:100 (09/11/2009), 100 (03/09/2009)  Chol:175 (09/11/2009), 173 (03/09/2009)  Trig:89.0 (09/11/2009), 69.0 (03/09/2009)  Problem # 3:  LOW BACK PAIN, CHRONIC (ICD-724.2) chronic pain syndrome  followed by dr Vear Clock His updated medication list for this problem includes:    Oxycodone-acetaminophen 5-325 Mg Tabs (Oxycodone-acetaminophen) .Marland Kitchen... Take 1 tablet by mouth two times a day as needed back pain  Problem # 4:  GERD (ICD-530.81) controlled continue current medications  His updated medication list for this problem includes:    Omeprazole 20 Mg Cpdr (Omeprazole) .Marland Kitchen... Take 1 tablet by mouth once a day  Complete Medication List: 1)  Atenolol 100 Mg Tabs (Atenolol) .... Take 1/2 tablet by mouth twice a day 2)  Doxazosin Mesylate 2 Mg Tabs (Doxazosin mesylate) .... Take 1/2 tablet by mouth two times a day 3)  Lovastatin 40 Mg Tabs (Lovastatin) .... Take 1 tablet by mouth once a day 4)  Omeprazole 20 Mg Cpdr (Omeprazole) .... Take 1 tablet by mouth once a day 5)  Quinapril Hcl 40 Mg Tabs (Quinapril hcl) .... Take 1 tablet by mouth once a day 6)  Icaps Plus Tabs (Multiple vitamins-minerals) .... Two times a day 7)  Oxycodone-acetaminophen 5-325 Mg Tabs (Oxycodone-acetaminophen) .... Take 1 tablet by mouth two times a day as needed  back pain 8)  Centrum Silver Tabs (Multiple vitamins-minerals) .... 2 tabs once daily 9)  Caltrate 600+d 600-400 Mg-unit Tabs (Calcium carbonate-vitamin d) .... Once daily 10)  Voltaren 1 % Gel (Diclofenac sodium) .... As needed  Patient Instructions: 1)  Please schedule a follow-up appointment in 6 months. CPX

## 2010-03-27 NOTE — Assessment & Plan Note (Signed)
Summary: ? STREP//CCM   Vital Signs:  Patient profile:   70 year old male Temp:     99.3 degrees F oral BP sitting:   130 / 80  (left arm) Cuff size:   regular  Vitals Entered By: Sid Falcon LPN (April 24, 2009 8:35 AM) CC: sore throat X 2 days   History of Present Illness: Patient is seen with sore throat which started last Friday. No significant nasal congestion. No ill exposures. Associated dry cough. Taking over-the-counter medications with mild relief. No fever. Denies any nausea, vomiting, or diarrhea.  Allergies: 1)  ! Asa 2)  ! * Small Pox Vaccine  Past History:  Past Medical History: Last updated: 03/24/2008 Hyperglycemia GERD Hyperlipidemia Hypertension Arthritis Ulcers Heart Murmur Colonic polyps, hx of chronic back pain---followed by dr Vear Clock PMH reviewed for relevance  Review of Systems      See HPI  Physical Exam  General:  Well-developed,well-nourished,in no acute distress; alert,appropriate and cooperative throughout examination Ears:  External ear exam shows no significant lesions or deformities.  Otoscopic examination reveals clear canals, tympanic membranes are intact bilaterally without bulging, retraction, inflammation or discharge. Hearing is grossly normal bilaterally. Nose:  External nasal examination shows no deformity or inflammation. Nasal mucosa are pink and moist without lesions or exudates. Mouth:  moderate erythema but no exudate Neck:  No deformities, masses, or tenderness noted. Lungs:  Normal respiratory effort, chest expands symmetrically. Lungs are clear to auscultation, no crackles or wheezes. Heart:  Normal rate and regular rhythm. S1 and S2 normal without gallop, murmur, click, rub or other extra sounds. Skin:  Intact without suspicious lesions or rashes Cervical Nodes:  No lymphadenopathy noted   Impression & Recommendations:  Problem # 1:  SORE THROAT (ICD-462) rapid strep neg.  Suspect viral.  Treat  symptomatically. Orders: Rapid Strep (76160)  Complete Medication List: 1)  Atenolol 100 Mg Tabs (Atenolol) .... Take 1/2 tablet by mouth twice a day 2)  Doxazosin Mesylate 2 Mg Tabs (Doxazosin mesylate) .... Take 1/2 tablet by mouth two times a day 3)  Lovastatin 40 Mg Tabs (Lovastatin) .... Take 1 tablet by mouth once a day 4)  Omeprazole 20 Mg Cpdr (Omeprazole) .... Take 1 tablet by mouth once a day 5)  Quinapril Hcl 40 Mg Tabs (Quinapril hcl) .... Take 1 tablet by mouth once a day 6)  Icaps Plus Tabs (Multiple vitamins-minerals) .... Two times a day 7)  Oxycodone-acetaminophen 5-325 Mg Tabs (Oxycodone-acetaminophen) .... Take 1 tablet by mouth two times a day as needed back pain 8)  Centrum Silver Tabs (Multiple vitamins-minerals) .... 2 tabs once daily 9)  Caltrate 600+d 600-400 Mg-unit Tabs (Calcium carbonate-vitamin d) .... Once daily 10)  Voltaren 1 % Gel (Diclofenac sodium) .... As needed  Patient Instructions: 1)  Consider Advil or Aleve for symptomatic relief of sore throat. 2)  Continue throat lozenges or Chloraseptic Spray for symptomatic relief 3)  Get plenty of rest, drink lots of clear liquids, and use Tylenol or Ibuprofen for fever and comfort. Return in 7-10 days if you're not better: sooner if you'er feeling worse.

## 2010-03-27 NOTE — Progress Notes (Signed)
Summary: omeprazole refill  Phone Note Refill Request Message from:  Fax from Pharmacy on August 30, 2009 3:19 PM  Refills Requested: Medication #1:  OMEPRAZOLE 20 MG CPDR Take 1 tablet by mouth once a day   Notes: [facility] [address]    Initial call taken by: Debbra Riding,  August 30, 2009 3:20 PM  Follow-up for Phone Call        See Rx. Follow-up by: Gladis Riffle, RN,  August 30, 2009 4:11 PM    Prescriptions: OMEPRAZOLE 20 MG CPDR (OMEPRAZOLE) Take 1 tablet by mouth once a day  #90 x 3   Entered by:   Gladis Riffle, RN   Authorized by:   Birdie Sons MD   Signed by:   Gladis Riffle, RN on 08/30/2009   Method used:   Electronically to        [facility] [address] (retail)       [address]       [place], [place]  [address]       Ph: [phone]       Fax: [phone]   RxID:   [id number]

## 2010-03-27 NOTE — Assessment & Plan Note (Signed)
Summary: STUNG BY WASP//ALP   Vital Signs:  Patient profile:   70 year old male Height:      69.5 inches Weight:      240 pounds BMI:     35.06 Temp:     98.2 degrees F oral Pulse rate:   68 / minute Resp:     14 per minute BP sitting:   160 / 90  (left arm)  Vitals Entered By: Willy Eddy, LPN (November 16, 2009 8:18 AM) CC: walk-in--c/o wasp sting on rt lower leg 4 days ago while mowing- now red,swollen and painful   CC:  walk-in--c/o wasp sting on rt lower leg 4 days ago while mowing- now red and swollen and painful.  History of Present Illness: presumed wasp sting 2 days ago painful 4/10 no fever or chills has local reaction--not worsening using otc benadryl and hydrocortisone cream  All other systems reviewed and were negative   Preventive Screening-Counseling & Management  Alcohol-Tobacco     Smoking Status: quit > 6 months     Year Started: 1955     Year Quit: 1995  Current Problems (verified): 1)  Low Back Pain, Chronic  (ICD-724.2) 2)  Colonic Polyps, Hx of  (ICD-V12.72) 3)  Hypertension  (ICD-401.9) 4)  Hyperlipidemia  (ICD-272.4) 5)  Gerd  (ICD-530.81)  Current Medications (verified): 1)  Atenolol 100 Mg Tabs (Atenolol) .... Take 1/2 Tablet By Mouth Twice A Day 2)  Doxazosin Mesylate 2 Mg Tabs (Doxazosin Mesylate) .... Take 1/2 Tablet By Mouth Two Times A Day 3)  Lovastatin 40 Mg Tabs (Lovastatin) .... Take 1 Tablet By Mouth Once A Day 4)  Omeprazole 20 Mg Cpdr (Omeprazole) .... Take 1 Tablet By Mouth Once A Day 5)  Quinapril Hcl 40 Mg Tabs (Quinapril Hcl) .... Take 1 Tablet By Mouth Once A Day 6)  Icaps Plus   Tabs (Multiple Vitamins-Minerals) .... Two Times A Day 7)  Oxycodone-Acetaminophen 5-325 Mg  Tabs (Oxycodone-Acetaminophen) .... Take 1 Tablet By Mouth Two Times A Day As Needed Back Pain 8)  Centrum Silver   Tabs (Multiple Vitamins-Minerals) .... 2 Tabs Once Daily 9)  Caltrate 600+d 600-400 Mg-Unit  Tabs (Calcium Carbonate-Vitamin D) ....  Once Daily 10)  Voltaren 1 %  Gel (Diclofenac Sodium) .... As Needed  Allergies (verified): 1)  ! Asa 2)  ! * Small Pox Vaccine  Past History:  Past Medical History: Last updated: 03/24/2008 Hyperglycemia GERD Hyperlipidemia Hypertension Arthritis Ulcers Heart Murmur Colonic polyps, hx of chronic back pain---followed by dr Vear Clock  Past Surgical History: Last updated: 12/22/2006 R Knee Surgery Lumbar Discectomy Appendectomy Cholecystectomy Tonsillectomy Panendoscopy  Family History: Last updated: 12/22/2006 Family History of Alcoholism/Addiction Family History Breast cancer 1st degree relative <50 Family History Diabetes 1st degree relative Family History Lung cancer Family History of Stroke M 1st degree relative <50  Social History: Last updated: 12/22/2006 Retired Married Former Smoker Alcohol use-no Drug use-no Regular exercise-no  Risk Factors: Exercise: no (12/22/2006)  Risk Factors: Smoking Status: quit > 6 months (11/16/2009)  Physical Exam  General:  alert and well-developed.   Skin:  minimal induration around wasp bite with surrounding erythema  2x2cm Inguinal Nodes:  no R inguinal adenopathy and no L inguinal adenopathy.     Impression & Recommendations:  Problem # 1:  WASP BITE (ICD-989.5) will self resolve see meds side effects discussed  Complete Medication List: 1)  Atenolol 100 Mg Tabs (Atenolol) .... Take 1/2 tablet by mouth twice a day 2)  Doxazosin Mesylate 2 Mg Tabs (Doxazosin mesylate) .... Take 1/2 tablet by mouth two times a day 3)  Lovastatin 40 Mg Tabs (Lovastatin) .... Take 1 tablet by mouth once a day 4)  Omeprazole 20 Mg Cpdr (Omeprazole) .... Take 1 tablet by mouth once a day 5)  Quinapril Hcl 40 Mg Tabs (Quinapril hcl) .... Take 1 tablet by mouth once a day 6)  Icaps Plus Tabs (Multiple vitamins-minerals) .... Two times a day 7)  Oxycodone-acetaminophen 5-325 Mg Tabs (Oxycodone-acetaminophen) .... Take 1 tablet  by mouth two times a day as needed back pain 8)  Centrum Silver Tabs (Multiple vitamins-minerals) .... 2 tabs once daily 9)  Caltrate 600+d 600-400 Mg-unit Tabs (Calcium carbonate-vitamin d) .... Once daily 10)  Voltaren 1 % Gel (Diclofenac sodium) .... As needed 11)  Triamcinolone Acetonide 0.5 % Crea (Triamcinolone acetonide) .... Apply bid to affected area Prescriptions: TRIAMCINOLONE ACETONIDE 0.5 % CREA (TRIAMCINOLONE ACETONIDE) apply bid to affected area  #30 grams x 1   Entered and Authorized by:   Birdie Sons MD   Signed by:   Birdie Sons MD on 11/16/2009   Method used:   Electronically to        [facility] [address] (retail)       [address]       [place], [place]  [address]       Ph: [phone]       Fax: [phone]   RxID:   [id number]

## 2010-03-29 NOTE — Assessment & Plan Note (Signed)
Summary: cpx//ccm   Vital Signs:  Patient profile:   70 year old male Height:      69.5 inches Weight:      250 pounds Temp:     98.7 degrees F oral Pulse rate:   64 / minute Pulse rhythm:   regular BP sitting:   144 / 76  (left arm) Cuff size:   large  Vitals Entered By: Alfred Levins, CMA (March 19, 2010 8:10 AM) CC: cpx   CC:  cpx.  History of Present Illness: cpx  Current Problems (verified): 1)  Colonic Polyps, Hx of  (ICD-V12.72) 2)  Hypertension  (ICD-401.9) 3)  Hyperlipidemia  (ICD-272.4) 4)  Gerd  (ICD-530.81)  Current Medications (verified): 1)  Atenolol 100 Mg Tabs (Atenolol) .... Take 1/2 Tablet By Mouth Twice A Day 2)  Doxazosin Mesylate 2 Mg Tabs (Doxazosin Mesylate) .... Take 1/2 Tablet By Mouth Two Times A Day 3)  Lovastatin 40 Mg Tabs (Lovastatin) .... Take 1 Tablet By Mouth Once A Day 4)  Omeprazole 20 Mg Cpdr (Omeprazole) .... Take 1 Tablet By Mouth Once A Day 5)  Quinapril Hcl 40 Mg Tabs (Quinapril Hcl) .... Take 1 Tablet By Mouth Once A Day 6)  Icaps Plus   Tabs (Multiple Vitamins-Minerals) .... Two Times A Day 7)  Oxycodone-Acetaminophen 5-325 Mg  Tabs (Oxycodone-Acetaminophen) .... Take 1 Tablet By Mouth Two Times A Day As Needed Back Pain 8)  Centrum Silver   Tabs (Multiple Vitamins-Minerals) .... 2 Tabs Once Daily 9)  Caltrate 600+d 600-400 Mg-Unit  Tabs (Calcium Carbonate-Vitamin D) .... Once Daily 10)  Voltaren 1 %  Gel (Diclofenac Sodium) .... As Needed 11)  Triamcinolone Acetonide 0.5 % Crea (Triamcinolone Acetonide) .... Apply Bid To Affected Area 12)  Tobramycin Sulfate 0.3 % Soln (Tobramycin Sulfate) .Marland Kitchen.. 1 Drop Each Eye Qid 13)  Prednisone 1% Solution .Marland Kitchen.. 1 Drop Each Eye Qid  Allergies (verified): 1)  ! Asa 2)  ! * Small Pox Vaccine  Past History:  Past Medical History: Last updated: 03/24/2008 Hyperglycemia GERD Hyperlipidemia Hypertension Arthritis Ulcers Heart Murmur Colonic polyps, hx of chronic back pain---followed by  dr Vear Clock  Past Surgical History: Last updated: 12/22/2006 R Knee Surgery Lumbar Discectomy Appendectomy Cholecystectomy Tonsillectomy Panendoscopy  Family History: Last updated: 12/22/2006 Family History of Alcoholism/Addiction Family History Breast cancer 1st degree relative <50 Family History Diabetes 1st degree relative Family History Lung cancer Family History of Stroke M 1st degree relative <50  Social History: Last updated: 12/22/2006 Retired Married Former Smoker Alcohol use-no Drug use-no Regular exercise-no  Risk Factors: Exercise: no (12/22/2006)  Risk Factors: Smoking Status: quit > 6 months (11/16/2009)  Physical Exam  General:  alert and well-developed.   Head:  normocephalic and atraumatic.   Eyes:  pupils equal and pupils round.   Ears:  R ear normal and L ear normal.   Nose:  no external deformity.   Neck:  No deformities, masses, or tenderness noted. Lungs:  Normal respiratory effort, chest expands symmetrically. Lungs are clear to auscultation, no crackles or wheezes. Heart:  normal rate and regular rhythm.   Abdomen:  soft and non-tender.  obese Rectal:  no external abnormalities and no hemorrhoids.   Prostate:  no nodules and no asymmetry.   Skin:  turgor normal and color normal.   Psych:  normally interactive and good eye contact.     Impression & Recommendations:  Problem # 1:  Preventive Health Care (ICD-V70.0) discussed need for aggressive weight loss with diet and  exercise  Problem # 2:  HYPERTENSION (ICD-401.9)  fair control discussed need for aggressive weight loss His updated medication list for this problem includes:    Atenolol 100 Mg Tabs (Atenolol) .Marland Kitchen... Take 1/2 tablet by mouth twice a day    Doxazosin Mesylate 2 Mg Tabs (Doxazosin mesylate) .Marland Kitchen... Take 1/2 tablet by mouth two times a day    Quinapril Hcl 40 Mg Tabs (Quinapril hcl) .Marland Kitchen... Take 1 tablet by mouth once a day  BP today: 144/76 Prior BP: 160/90  (11/16/2009)  Labs Reviewed: K+: 4.2 (03/12/2010) Creat: : 0.9 (03/12/2010)   Chol: 158 (03/12/2010)   HDL: 53.90 (03/12/2010)   LDL: 86 (03/12/2010)   TG: 92.0 (03/12/2010)  Orders: DRE (G0102)  Problem # 3:  HYPERLIPIDEMIA (ICD-272.4)  controlled continue current medications  His updated medication list for this problem includes:    Lovastatin 40 Mg Tabs (Lovastatin) .Marland Kitchen... Take 1 tablet by mouth once a day  Labs Reviewed: SGOT: 20 (03/12/2010)   SGPT: 18 (03/12/2010)   HDL:53.90 (03/12/2010), 57.30 (09/11/2009)  LDL:86 (03/12/2010), 100 (16/11/9602)  Chol:158 (03/12/2010), 175 (09/11/2009)  Trig:92.0 (03/12/2010), 89.0 (09/11/2009)  Orders: DRE (G0102)  Complete Medication List: 1)  Atenolol 100 Mg Tabs (Atenolol) .... Take 1/2 tablet by mouth twice a day 2)  Doxazosin Mesylate 2 Mg Tabs (Doxazosin mesylate) .... Take 1/2 tablet by mouth two times a day 3)  Lovastatin 40 Mg Tabs (Lovastatin) .... Take 1 tablet by mouth once a day 4)  Omeprazole 20 Mg Cpdr (Omeprazole) .... Take 1 tablet by mouth once a day 5)  Quinapril Hcl 40 Mg Tabs (Quinapril hcl) .... Take 1 tablet by mouth once a day 6)  Icaps Plus Tabs (Multiple vitamins-minerals) .... Two times a day 7)  Oxycodone-acetaminophen 5-325 Mg Tabs (Oxycodone-acetaminophen) .... Take 1 tablet by mouth two times a day as needed back pain 8)  Centrum Silver Tabs (Multiple vitamins-minerals) .... 2 tabs once daily 9)  Caltrate 600+d 600-400 Mg-unit Tabs (Calcium carbonate-vitamin d) .... Once daily 10)  Voltaren 1 % Gel (Diclofenac sodium) .... As needed 11)  Triamcinolone Acetonide 0.5 % Crea (Triamcinolone acetonide) .... Apply bid to affected area 12)  Tobramycin Sulfate 0.3 % Soln (Tobramycin sulfate) .Marland Kitchen.. 1 drop each eye qid 13)  Prednisone 1% Solution  .Marland Kitchen.. 1 drop each eye qid  Patient Instructions: 1)  Please schedule a follow-up appointment in 6 months. 2)  bmet--995.2 3)  lipids 272.4 4)  liver 995.2    Orders  Added: 1)  Est. Patient 65& > [99397] 2)  DRE [G0102]

## 2010-04-06 ENCOUNTER — Other Ambulatory Visit: Payer: Self-pay | Admitting: Internal Medicine

## 2010-04-10 ENCOUNTER — Other Ambulatory Visit: Payer: Self-pay | Admitting: *Deleted

## 2010-04-10 NOTE — Telephone Encounter (Signed)
Opened in error

## 2010-04-19 ENCOUNTER — Telehealth: Payer: Self-pay | Admitting: Internal Medicine

## 2010-04-19 DIAGNOSIS — L600 Ingrowing nail: Secondary | ICD-10-CM

## 2010-04-19 NOTE — Telephone Encounter (Signed)
Triage vm-----wife is requesting a referral to a podiatrist. Pt has a painful ingrown toenail.

## 2010-04-19 NOTE — Telephone Encounter (Signed)
Referral order placed.

## 2010-05-28 LAB — POCT URINALYSIS DIP (DEVICE)
Specific Gravity, Urine: 1.025 (ref 1.005–1.030)
pH: 5 (ref 5.0–8.0)

## 2010-05-28 LAB — DIFFERENTIAL
Basophils Relative: 0 % (ref 0–1)
Eosinophils Absolute: 0.1 10*3/uL (ref 0.0–0.7)
Eosinophils Relative: 1 % (ref 0–5)
Lymphocytes Relative: 16 % (ref 12–46)
Monocytes Absolute: 1 10*3/uL (ref 0.1–1.0)
Neutro Abs: 9 10*3/uL — ABNORMAL HIGH (ref 1.7–7.7)

## 2010-05-28 LAB — CBC
MCV: 87.1 fL (ref 78.0–100.0)
RBC: 4.85 MIL/uL (ref 4.22–5.81)
WBC: 12.1 10*3/uL — ABNORMAL HIGH (ref 4.0–10.5)

## 2010-05-28 LAB — POCT I-STAT, CHEM 8
Chloride: 107 mEq/L (ref 96–112)
Creatinine, Ser: 1 mg/dL (ref 0.4–1.5)
Glucose, Bld: 108 mg/dL — ABNORMAL HIGH (ref 70–99)
HCT: 45 % (ref 39.0–52.0)
Hemoglobin: 15.3 g/dL (ref 13.0–17.0)
Sodium: 141 mEq/L (ref 135–145)
TCO2: 29 mmol/L (ref 0–100)

## 2010-06-07 LAB — CBC
Hemoglobin: 15.3 g/dL (ref 13.0–17.0)
RBC: 5.05 MIL/uL (ref 4.22–5.81)
RDW: 13 % (ref 11.5–15.5)

## 2010-06-07 LAB — BASIC METABOLIC PANEL
CO2: 27 mEq/L (ref 19–32)
Calcium: 9.2 mg/dL (ref 8.4–10.5)
Chloride: 107 mEq/L (ref 96–112)
Creatinine, Ser: 0.82 mg/dL (ref 0.4–1.5)
GFR calc non Af Amer: >60 mL/min (ref >60)
Glucose, Bld: 115 mg/dL — ABNORMAL HIGH (ref 70–99)
Potassium: 4.6 mEq/L (ref 3.5–5.1)

## 2010-06-13 ENCOUNTER — Other Ambulatory Visit: Payer: Self-pay | Admitting: Internal Medicine

## 2010-06-18 ENCOUNTER — Encounter: Payer: Self-pay | Admitting: Internal Medicine

## 2010-06-18 ENCOUNTER — Ambulatory Visit (INDEPENDENT_AMBULATORY_CARE_PROVIDER_SITE_OTHER): Payer: Medicare Other | Admitting: Internal Medicine

## 2010-06-18 DIAGNOSIS — R21 Rash and other nonspecific skin eruption: Secondary | ICD-10-CM

## 2010-06-18 DIAGNOSIS — R229 Localized swelling, mass and lump, unspecified: Secondary | ICD-10-CM

## 2010-06-18 MED ORDER — DOXYCYCLINE HYCLATE 100 MG PO TABS: 100.0000 mg | ORAL_TABLET | Freq: Two times a day (BID) | ORAL | Status: DC

## 2010-06-18 MED ORDER — NYSTATIN-TRIAMCINOLONE 100000-0.1 UNIT/GM-% EX OINT: TOPICAL_OINTMENT | Freq: Two times a day (BID) | CUTANEOUS | Status: DC

## 2010-06-24 DIAGNOSIS — R229 Localized swelling, mass and lump, unspecified: Secondary | ICD-10-CM | POA: Insufficient documentation

## 2010-06-24 DIAGNOSIS — R21 Rash and other nonspecific skin eruption: Secondary | ICD-10-CM | POA: Insufficient documentation

## 2010-06-24 NOTE — Assessment & Plan Note (Signed)
Attempt po abx. Followup if no improvement or worsening

## 2010-06-24 NOTE — Assessment & Plan Note (Signed)
Attempt triamcinilone nystatin to affected area. Followup if no improvement or worsening.

## 2010-06-24 NOTE — Progress Notes (Signed)
  Subjective:    Patient ID: Xavier Cook, male    DOB: 10/21/1940, 70 y.o.   MRN: [id number]  HPI Patient presents to clinic for evaluation of soft tissue nodule. Patient notes one-week history of small soft tissue nodule located on lower aspect of the penile shaft. No drainage erythema or tenderness. No fever or chills. No urethral discharge. Also notes chronic intermittent inguinal irritation. Attempted over-the-counter fungal cream with improvement. Also attempted over-the-counter hydrocortisone with improvement. Area is itchy. No wound or drainage.No other alleviating or exacerbating factors. No other complaints.  Reviewed past medical history, medications and allergies    Review of Systems see history of present illness     Objective:   Physical Exam  Nursing note and vitals reviewed. Constitutional: He appears well-developed and well-nourished. No distress.  HENT:  Head: Normocephalic and atraumatic.  Right Ear: External ear normal.  Left Ear: External ear normal.  Eyes: Conjunctivae are normal. No scleral icterus.  Genitourinary:       lower aspect of penile shaft demonstrates small soft tissue mass approximate 3 mm in transverse diameter. Area is well-circumscribed. No drainage or tenderness. Most consistent with possible cyst. Bilateral inguinal creases with mild erythema without drainage wound or rash  Neurological: He is alert.  Skin: Skin is warm and dry. No rash noted. He is not diaphoretic. There is erythema. No pallor.  Psychiatric: He has a normal mood and affect.          Assessment & Plan:

## 2010-06-26 ENCOUNTER — Other Ambulatory Visit: Payer: Self-pay

## 2010-06-26 MED ORDER — DOXYCYCLINE HYCLATE 100 MG PO TABS: 100.0000 mg | ORAL_TABLET | Freq: Two times a day (BID) | ORAL | Status: AC

## 2010-06-26 NOTE — Telephone Encounter (Signed)
Pt in offc with wife. Has been taking doxycycline for skin nodule. Pt notes that abx is working but he thinks he may need another round to complete the tx. Please advise

## 2010-07-10 NOTE — Discharge Summary (Signed)
NAMEWILLET, Xavier Cook              ACCOUNT NO.:  [id number]   MEDICAL RECORD NO.:  [id number]          PATIENT TYPE:  OBV   LOCATION:  1435                         FACILITY:  [facility]   PHYSICIAN:  Rosalyn Gess. Norins, MD  DATE OF BIRTH:  12-23-1940   DATE OF ADMISSION:  01/26/2007  DATE OF DISCHARGE:  01/27/2007                               DISCHARGE SUMMARY   ADMITTING DIAGNOSIS:  Atypical chest pain.   DISCHARGE DIAGNOSIS:  Myocardial infarction ruled out.   HISTORY OF PRESENT ILLNESS:  The patient is a 70 year old Caucasian male  who presented to the emergency department complaining of chest pain and  shortness of breath which occurred during exercise.  Please see the  admission note for details.  It is important to note the patient's  discomfort was relieved with sublingual nitroglycerin.  The patient  reports he had had an episode of chest pain several years ago in [place]  and did have a full workup which at that time was negative.  Because of  the patient's symptoms, he was admitted for rule out MI.   Please see admit note for past medical history, family history, social  history.   HOSPITAL COURSE:  The patient was admitted to a telemetry unit.  He had  cardiac enzymes that were negative in the ER and repeat x2 were negative  with a CK of 68 to 61, troponin-I of 0.02 to 0.02.  The patient had CT  angio which ruled out for PE and had no active disease.  The patient  remained asymptomatic and felt comfortable.  With the patient having  ruled out by cardiac enzymes with no evidence of PE by CT angio, the  patient is thought to be stable and ready for discharge home with close  followup by Dr. Birdie Sons.  The patient will need to be scheduled for  an outpatient stress test.   DISCHARGE EXAM:  Temperature was 97.7, blood pressure 153/80, heart rate  was 57, respirations were 16.  GENERAL APPEARANCE:  A well-nourished, well-developed Caucasian male who  looks his stated age,  in no acute distress.  HEENT:  Exam was unremarkable.  CHEST:  Patient is moving air well.  No rales, wheezes or rhonchi were  appreciated.  CARDIOVASCULAR:  2+ radial pulse.  He had no JVD or  carotid bruits.  He had a regular rate and rhythm without murmurs, rubs  or gallops.   DISCHARGE MEDICATIONS:  The patient will continue on all of his home  medications including:  1. Atenolol 50 mg b.i.d.  2. Doxazosin 2 mg daily.  3. Lovastatin 40 mg daily.  4. Nexium 40 mg daily.  5. Quinapril 40 mg daily.  6. Tramadol 50 mg p.r.n.   The patient is to contact Dr. Cato Mulligan' office for any pain, discomfort or  other change in his medical condition.  He will be notified about the  time and date for his stress test.   The patient's condition at time of discharge dictation is stable and  improved.      Rosalyn Gess Norins, MD  Electronically Signed  MEN/MEDQ  D:  01/27/2007  T:  01/27/2007  Job:  [id number]   cc:   Valetta Mole. Swords, MD  [address] [place]  [place] [address]

## 2010-07-10 NOTE — Op Note (Signed)
NAME:  DRAY, DENTE              ACCOUNT NO.:  [id number]   MEDICAL RECORD NO.:  [id number]          PATIENT TYPE:  AMB   LOCATION:  SDS                          FACILITY:  [facility]   PHYSICIAN:  Vanita Panda. Magnus Ivan, M.D.DATE OF BIRTH:  [birth year]   DATE OF PROCEDURE:  05/13/2008  DATE OF DISCHARGE:  05/13/2008                               OPERATIVE REPORT   PREOPERATIVE DIAGNOSIS:  Right knee medial and lateral meniscal tears.   POSTOPERATIVE DIAGNOSES:  1. Right knee complex anterior to midbody lateral meniscal tear.  2. Grade 3 chondromalacia, medial femoral condyle.  3. Grade 4 chondromalacia, patella and trochlear groove.   PROCEDURE:  Right knee arthroscopy with extensive debridement and  partial lateral meniscectomy.   SURGEON:  Vanita Panda. Magnus Ivan, MD   ANESTHESIA:  General.   BLOOD LOSS:  Minimal.   COMPLICATIONS:  None.   INDICATION:  Briefly, Mr. Utter is a 70 year old very active  individual who actually looks much younger than his stated age.  He had  not had any previous right knee pain and then had a twisting injury to  his knee several weeks ago and his knee continued to swell and intra-  articular direction steroids was not felt relieving pain or his locking  and catching.  An MRI was obtained and it showed a complex lateral  meniscal tear as well as a degenerative medial meniscal tear and only  full-thickness cartilage loss at the patella.  It was recommend that he  undergo arthroscopy of this knee given his mechanical symptoms and the  fact that he has been quite active individual with no problems with his  knee before.   The risks and benefits of surgery were explained to him at length and he  agreed to proceed with surgery.   PROCEDURE DESCRIPTION:  After informed consent was obtained, appropriate  right knee was marked.  He was brought to the operating room and placed  supine on the operating table.  General anesthesia was then obtained.   A  nonsterile tourniquet was placed around his upper right thigh but was  not utilized during the case.  His right knee was prepped and draped  with DuraPrep and sterile drapes including sterile stockinette.  The  lateral leg post was utilized with the bed raised.  The knee was flexed  off the side of table.  Time-out was called to identify the correct  patient and correct right knee.  I then made an anterolateral portal  just lateral to the patellar tendon and an arthroscope was inserted.  There was a large effusion that was encountered.  I went directly to the  medial compartment and made an anterior medial arthroscopy portal.  The  medial compartment was probed and there was around grade 3 changes on  the medial femoral condyle only.  An arthroscopic shaver was able to  debride this back to stable margin.  There was no exposed bone.  There  was a medial plica that I debrided and then in the undersurface the  patella, we could see that there was significant cartilage loss and I  was able to debride this back to a stable margin.  When I went to the  lateral compartment with the knee in a figure-of-four position,  there  was extensive tearing of the anterior to mid body of the lateral  meniscus.  Up cutting biters as well as shaver were able to debride this  back to a stable margin.  The PCL and ACL were also intact.  I then  allowed fluid to lavage through the knee and then drained the fluid from  the knee.  I removed all instrumentation and I was able to close the  portal sites with interrupted 3-0 nylon suture.  Clonidine, Marcaine,  and morphine were then inserted to the knee joint as well as arthroscopy  portal sites.  Xeroform followed by well-padded sterile dressing and an  Ace wrap was applied.  The patient was awakened, extubated, and taken to  recovery room in stable condition.  Postoperatively, allow him to  weightbear as tolerated and increase his activities slowly with  follow  up in the office in 1 week.       Vanita Panda. Magnus Ivan, M.D.  Electronically Signed     CYB/MEDQ  D:  05/13/2008  T:  05/14/2008  Job:  [id number]

## 2010-07-10 NOTE — Assessment & Plan Note (Signed)
[facility]                                 ON-CALL NOTE   NAME:[name], Xavier Cook                       MRN:          [id number]  DATE:02/17/2009                            DOB:          1940/04/02    PHONE NUMBER:  [phone].   Dr. Cato Mulligan is the regular doctor and I am Dr. Milinda Antis on-call.   Caller is his wife, but actually spoke to the patient.   CHIEF COMPLAINT:  Abdominal pain.   The patient states for several days he has had left upper abdomen pain.  It is intensifying and becoming quite severe.  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  He is unsure whether that was bleeding or not.  He  said the pain is getting similar to that.  He has not taken anything  over-the-counter, but took an oxycodone that he had left over for back  pain.  He denies nausea, vomiting or fever.  I told him in light of his  severe pain and history of an ulcer that he needs evaluation now, to go  onto either [facility] ER or Urgent Care.     Marne A. Tower, MD     MAT/MedQ  DD: 02/17/2009  DT: 02/17/2009  Job #: [id number]

## 2010-07-17 ENCOUNTER — Ambulatory Visit (INDEPENDENT_AMBULATORY_CARE_PROVIDER_SITE_OTHER): Payer: Medicare Other | Admitting: Internal Medicine

## 2010-07-17 ENCOUNTER — Encounter: Payer: Self-pay | Admitting: Internal Medicine

## 2010-07-17 VITALS — BP 142/74 | HR 80 | Temp 98.6°F | Ht 71.0 in | Wt 243.0 lb

## 2010-07-17 DIAGNOSIS — N4889 Other specified disorders of penis: Secondary | ICD-10-CM

## 2010-07-17 NOTE — Progress Notes (Signed)
  Subjective:    Patient ID: Xavier Cook, male    DOB: 1941-02-25, 70 y.o.   MRN: [id number]  HPI  Reviewed dr hodgin's note---sxs have resolved He had a subcutaneous cyst on penis--minimally painful. Treated with ABX-now resolved  No past medical history on file. No past surgical history on file.  reports that he has quit smoking. He does not have any smokeless tobacco history on file. He reports that he does not drink alcohol. His drug history not on file. family history is not on file. Allergies  Allergen Reactions  . Aspirin      Review of Systems     Objective:   Physical Exam Small cyst---penis-no erythema, no pain       Assessment & Plan:  Penile cyst---no treatment

## 2010-08-29 ENCOUNTER — Other Ambulatory Visit: Payer: Self-pay | Admitting: Internal Medicine

## 2010-09-11 ENCOUNTER — Other Ambulatory Visit: Payer: Self-pay | Admitting: Internal Medicine

## 2010-09-17 ENCOUNTER — Other Ambulatory Visit (INDEPENDENT_AMBULATORY_CARE_PROVIDER_SITE_OTHER): Payer: Medicare Other

## 2010-09-17 DIAGNOSIS — E785 Hyperlipidemia, unspecified: Secondary | ICD-10-CM

## 2010-09-17 DIAGNOSIS — T887XXA Unspecified adverse effect of drug or medicament, initial encounter: Secondary | ICD-10-CM

## 2010-09-17 LAB — HEPATIC FUNCTION PANEL
Albumin: 4.1 g/dL (ref 3.5–5.2)
Total Bilirubin: 0.8 mg/dL (ref 0.3–1.2)

## 2010-09-17 LAB — LIPID PANEL
Cholesterol: 167 mg/dL (ref 0–200)
HDL: 63.7 mg/dL (ref >39.00)
LDL Cholesterol: 89 mg/dL (ref 0–99)
Triglycerides: 72 mg/dL (ref 0.0–149.0)
VLDL: 14.4 mg/dL (ref 0.0–40.0)

## 2010-09-17 LAB — BASIC METABOLIC PANEL
BUN: 29 mg/dL — ABNORMAL HIGH (ref 6–23)
Calcium: 8.7 mg/dL (ref 8.4–10.5)
Creatinine, Ser: 0.8 mg/dL (ref 0.4–1.5)
GFR: 107.09 mL/min (ref >60.00)
Glucose, Bld: 114 mg/dL — ABNORMAL HIGH (ref 70–99)
Potassium: 4.2 mEq/L (ref 3.5–5.1)

## 2010-09-19 ENCOUNTER — Other Ambulatory Visit: Payer: Self-pay | Admitting: *Deleted

## 2010-09-19 MED ORDER — DOXAZOSIN MESYLATE 2 MG PO TABS: 2.0000 mg | ORAL_TABLET | Freq: Every day | ORAL | Status: DC

## 2010-09-24 ENCOUNTER — Ambulatory Visit: Payer: Self-pay | Admitting: Internal Medicine

## 2010-10-23 ENCOUNTER — Ambulatory Visit: Payer: Self-pay | Admitting: Internal Medicine

## 2010-10-26 ENCOUNTER — Encounter: Payer: Self-pay | Admitting: Internal Medicine

## 2010-10-26 ENCOUNTER — Ambulatory Visit (INDEPENDENT_AMBULATORY_CARE_PROVIDER_SITE_OTHER): Payer: Medicare Other | Admitting: Internal Medicine

## 2010-10-26 ENCOUNTER — Telehealth: Payer: Self-pay | Admitting: Internal Medicine

## 2010-10-26 VITALS — BP 156/84 | HR 60 | Temp 98.3°F | Ht 71.0 in | Wt 244.0 lb

## 2010-10-26 DIAGNOSIS — K219 Gastro-esophageal reflux disease without esophagitis: Secondary | ICD-10-CM

## 2010-10-26 DIAGNOSIS — E785 Hyperlipidemia, unspecified: Secondary | ICD-10-CM

## 2010-10-26 DIAGNOSIS — I10 Essential (primary) hypertension: Secondary | ICD-10-CM

## 2010-10-26 MED ORDER — DOXAZOSIN MESYLATE 4 MG PO TABS: 4.0000 mg | ORAL_TABLET | Freq: Every day | ORAL | Status: DC

## 2010-10-26 NOTE — Assessment & Plan Note (Signed)
Well controlled  uses PPI every other day

## 2010-10-26 NOTE — Assessment & Plan Note (Signed)
Controlled Continue same meds 

## 2010-10-26 NOTE — Assessment & Plan Note (Signed)
Not as well controlled Increase cardura

## 2010-10-26 NOTE — Progress Notes (Signed)
  Subjective:    Patient ID: Xavier Cook, male    DOB: 1940-06-03, 70 y.o.   MRN: [id number]  HPI  Patient Active Problem List  Diagnoses  . HYPERLIPIDEMIA--tolerating meds  . HYPERTENSION--home bps 135-150/58-72  . GERD---no trouble on PPI   No past medical history on file. Past Surgical History  Procedure Date  . Cataract extraction, bilateral 2012    reports that he quit smoking about 15 years ago. He does not have any smokeless tobacco history on file. He reports that he does not drink alcohol. His drug history not on file. family history includes Breast cancer in his sister; Hypertension in his mother; and Stroke in his mother. Allergies  Allergen Reactions  . Aspirin    Wife died several months ago  Review of Systems  patient denies chest pain, shortness of breath, orthopnea. Denies lower extremity edema, abdominal pain, change in appetite, change in bowel movements. Patient denies rashes, musculoskeletal complaints. No other specific complaints in a complete review of systems.      Objective:   Physical Exam   well-developed well-nourished male in no acute distress. HEENT exam atraumatic, normocephalic, neck supple without jugular venous distention. Chest clear to auscultation cardiac exam S1-S2 are regular. Abdominal exam overweight with bowel sounds, soft and nontender. Extremities no edema. Neurologic exam is alert with a normal gait.       Assessment & Plan:

## 2010-10-26 NOTE — Telephone Encounter (Signed)
Was just see this morning.Please clarify the directions of his generic Cardura. Patient is confused about dosing. Thanks.

## 2010-10-29 NOTE — Telephone Encounter (Signed)
That dose in the chart is correct

## 2010-10-30 NOTE — Telephone Encounter (Signed)
Pt aware.

## 2010-12-03 LAB — COMPREHENSIVE METABOLIC PANEL WITH GFR
ALT: 20
Albumin: 3.4 — ABNORMAL LOW
Alkaline Phosphatase: 57
Calcium: 9.2
Glucose, Bld: 101 — ABNORMAL HIGH
Potassium: 4.7
Sodium: 142
Total Protein: 6.4

## 2010-12-03 LAB — COMPREHENSIVE METABOLIC PANEL
AST: 21
BUN: 15
CO2: 28
Chloride: 110
Creatinine, Ser: 0.81
GFR calc Af Amer: >60
GFR calc non Af Amer: >60
Total Bilirubin: 0.8

## 2010-12-03 LAB — CBC
HCT: 39.6
Hemoglobin: 13.7
MCHC: 34.6
MCV: 84.7
Platelets: 202
RBC: 4.68
RDW: 13
WBC: 8.9

## 2010-12-03 LAB — CARDIAC PANEL(CRET KIN+CKTOT+MB+TROPI)
CK, MB: 0.9
CK, MB: 1.1
Relative Index: INVALID
Total CK: 68
Troponin I: 0.02

## 2010-12-03 LAB — DIFFERENTIAL
Basophils Absolute: 0
Basophils Relative: 0
Eosinophils Absolute: 0.2
Eosinophils Relative: 3
Lymphocytes Relative: 21
Lymphs Abs: 1.9
Monocytes Absolute: 0.7
Monocytes Relative: 8
Neutro Abs: 6.1
Neutrophils Relative %: 68

## 2010-12-03 LAB — URINALYSIS, ROUTINE W REFLEX MICROSCOPIC
Bilirubin Urine: NEGATIVE
Glucose, UA: NEGATIVE
Hgb urine dipstick: NEGATIVE
Ketones, ur: NEGATIVE
Nitrite: NEGATIVE
Protein, ur: NEGATIVE
Specific Gravity, Urine: 1.015
Urobilinogen, UA: 0.2
pH: 6.5

## 2010-12-03 LAB — APTT: aPTT: 23 — ABNORMAL LOW

## 2010-12-03 LAB — BASIC METABOLIC PANEL
CO2: 28
Chloride: 106
GFR calc Af Amer: >60
Potassium: 4
Sodium: 139

## 2010-12-03 LAB — TROPONIN I: Troponin I: 0.02

## 2010-12-03 LAB — POCT CARDIAC MARKERS
CKMB, poc: 1
Myoglobin, poc: 64.5
Operator id: 3067
Troponin i, poc: <0.05

## 2010-12-03 LAB — PROTIME-INR
INR: 0.9
Prothrombin Time: 12.1

## 2010-12-03 LAB — CK TOTAL AND CKMB (NOT AT ARMC): Relative Index: INVALID

## 2010-12-11 ENCOUNTER — Other Ambulatory Visit: Payer: Self-pay | Admitting: Internal Medicine

## 2011-01-22 ENCOUNTER — Other Ambulatory Visit: Payer: Self-pay | Admitting: Internal Medicine

## 2011-01-28 ENCOUNTER — Ambulatory Visit (INDEPENDENT_AMBULATORY_CARE_PROVIDER_SITE_OTHER): Payer: Medicare Other | Admitting: Internal Medicine

## 2011-01-28 DIAGNOSIS — Z23 Encounter for immunization: Secondary | ICD-10-CM

## 2011-02-27 ENCOUNTER — Other Ambulatory Visit: Payer: Self-pay | Admitting: Internal Medicine

## 2011-02-28 ENCOUNTER — Other Ambulatory Visit: Payer: Self-pay | Admitting: Internal Medicine

## 2011-03-22 ENCOUNTER — Encounter: Payer: Self-pay | Admitting: Internal Medicine

## 2011-03-27 ENCOUNTER — Telehealth: Payer: Self-pay | Admitting: Internal Medicine

## 2011-03-27 NOTE — Telephone Encounter (Signed)
He denies any reflux symptoms at this time.  He is taking omeprazole daily.  His GERD is well controlled.  Do you want to add EGD onto his recall colon?

## 2011-03-27 NOTE — Telephone Encounter (Signed)
Chart reviewed. I do not recommend an EGD at this time.

## 2011-03-28 NOTE — Telephone Encounter (Signed)
Patient is advised.  I have scheduled him for an appt for 04/30/11 for pre-visit, 05/07/11 for colon

## 2011-04-25 ENCOUNTER — Telehealth: Payer: Self-pay | Admitting: Internal Medicine

## 2011-04-25 NOTE — Telephone Encounter (Signed)
Highest BP at Pain Management 180/80, 110/60  This am 168/74.  Pt will wait until tomorrow to see Dr. Cato Mulligan.  He feels fine, and will call or go to the ER with any chest pain or unusual symptoms.

## 2011-04-25 NOTE — Telephone Encounter (Signed)
Patient called stating that his bp has been elevated and fluctuating and would like a call of assurance concerning waiting for his a.m. Appt. Patient adds that he has a little left arm pain. Spoke with triage and she will call patient.

## 2011-04-26 ENCOUNTER — Ambulatory Visit (INDEPENDENT_AMBULATORY_CARE_PROVIDER_SITE_OTHER): Payer: Medicare Other | Admitting: Internal Medicine

## 2011-04-26 ENCOUNTER — Encounter: Payer: Self-pay | Admitting: Internal Medicine

## 2011-04-26 VITALS — BP 146/84 | HR 70 | Temp 98.2°F | Ht 71.0 in | Wt 247.0 lb

## 2011-04-26 DIAGNOSIS — I10 Essential (primary) hypertension: Secondary | ICD-10-CM

## 2011-04-26 MED ORDER — AMLODIPINE BESYLATE 5 MG PO TABS: 5.0000 mg | ORAL_TABLET | Freq: Every day | ORAL | Status: DC

## 2011-04-26 NOTE — Assessment & Plan Note (Signed)
BPS are variable. In general  Bps are elevated. I think he needs additional  Treatment Add norvasc

## 2011-04-26 NOTE — Progress Notes (Signed)
Patient ID: Xavier Cook, male   DOB: 19-Sep-1940, 71 y.o.   MRN: [id number] htn---has had bps measured at pain mgmt and at gym and at home. BPs have been variable: 110/70-181/62.. Otherwise feeling well  No past medical history on file.  History   Social History  . Marital Status: Married    Spouse Name: N/A    Number of Children: N/A  . Years of Education: N/A   Occupational History  . Not on file.   Social History Main Topics  . Smoking status: Former Smoker    Quit date: 10/26/1995  . Smokeless tobacco: Not on file  . Alcohol Use: No  . Drug Use: Not on file  . Sexually Active: Not on file   Other Topics Concern  . Not on file   Social History Narrative  . No narrative on file    Past Surgical History  Procedure Date  . Cataract extraction, bilateral 2012    Family History  Problem Relation Age of Onset  . Stroke Mother   . Hypertension Mother   . Breast cancer Sister     Allergies  Allergen Reactions  . Aspirin     Current Outpatient Prescriptions on File Prior to Visit  Medication Sig Dispense Refill  . Calcium Carbonate-Vit D-Min (CALTRATE 600+D PLUS) 600-400 MG-UNIT per tablet Take 1 tablet by mouth daily.        Marland Kitchen doxazosin (CARDURA) 4 MG tablet Take 1 tablet (4 mg total) by mouth at bedtime.  90 tablet  3  . lovastatin (MEVACOR) 40 MG tablet TAKE ONE TABLET BY MOUTH ONCE A DAY  90 tablet  1  . Multiple Vitamin (MULTIVITAMIN) tablet Take 2 tablets by mouth daily.       Marland Kitchen omeprazole (PRILOSEC) 20 MG capsule TAKE ONE CAPSULE  BY MOUTH ONCE A DAY  90 capsule  2  . quinapril (ACCUPRIL) 40 MG tablet TAKE ONE TABLET BY MOUTH ONCE A DAY  90 tablet  0     patient denies chest pain, shortness of breath, orthopnea. Denies lower extremity edema, abdominal pain, change in appetite, change in bowel movements. Patient denies rashes, musculoskeletal complaints. No other specific complaints in a complete review of systems.   BP 146/84  Pulse 70  Temp(Src) 98.2 F  (36.8 C) (Oral)  Ht 5\' 11"  (1.803 m)  Wt 247 lb (112.038 kg)  BMI 34.45 kg/m2  well-developed well-nourished male in no acute distress. HEENT exam atraumatic, normocephalic, neck supple without jugular venous distention. Chest clear to auscultation cardiac exam S1-S2 are regular. Abdominal exam overweight with bowel sounds, soft and nontender. Extremities no edema. Neurologic exam is alert with a normal gait.

## 2011-04-30 ENCOUNTER — Encounter: Payer: Self-pay | Admitting: Internal Medicine

## 2011-04-30 ENCOUNTER — Ambulatory Visit (AMBULATORY_SURGERY_CENTER): Payer: Medicare Other

## 2011-04-30 VITALS — Ht 71.0 in | Wt 248.6 lb

## 2011-04-30 DIAGNOSIS — Z1211 Encounter for screening for malignant neoplasm of colon: Secondary | ICD-10-CM

## 2011-04-30 DIAGNOSIS — Z860100 Personal history of colon polyps, unspecified: Secondary | ICD-10-CM

## 2011-04-30 MED ORDER — PEG-KCL-NACL-NASULF-NA ASC-C 100 G PO SOLR: 1.0000 | Freq: Once | ORAL | Status: AC

## 2011-05-07 ENCOUNTER — Ambulatory Visit (AMBULATORY_SURGERY_CENTER): Payer: Medicare Other | Admitting: Internal Medicine

## 2011-05-07 ENCOUNTER — Encounter: Payer: Self-pay | Admitting: Internal Medicine

## 2011-05-07 VITALS — BP 144/78 | HR 69 | Temp 98.3°F | Resp 19 | Ht 71.0 in | Wt 248.0 lb

## 2011-05-07 DIAGNOSIS — K5730 Diverticulosis of large intestine without perforation or abscess without bleeding: Secondary | ICD-10-CM

## 2011-05-07 DIAGNOSIS — Z1211 Encounter for screening for malignant neoplasm of colon: Secondary | ICD-10-CM

## 2011-05-07 DIAGNOSIS — K648 Other hemorrhoids: Secondary | ICD-10-CM

## 2011-05-07 DIAGNOSIS — Z86010 Personal history of colonic polyps: Secondary | ICD-10-CM

## 2011-05-07 DIAGNOSIS — D126 Benign neoplasm of colon, unspecified: Secondary | ICD-10-CM

## 2011-05-07 MED ORDER — SODIUM CHLORIDE 0.9 % IV SOLN: 500.0000 mL | INTRAVENOUS | Status: DC

## 2011-05-07 NOTE — Op Note (Signed)
[facility] [address] [place], [place]  [address]  COLONOSCOPY PROCEDURE REPORT  PATIENT:  Xavier Cook, Xavier Cook  MR#:  [id number] BIRTHDATE:  Jun 28, 1940, 70 yrs. old  GENDER:  male ENDOSCOPIST:  Iva Boop, MD, [facility]  PROCEDURE DATE:  05/07/2011 PROCEDURE:  Colonoscopy with biopsy and snare polypectomy ASA CLASS:  Class II INDICATIONS:  surveillance and high-risk screening, history of polyps had polyps removed when in [place] (no pathology available), 2008 colonoscopy no polyps [name] MEDICATIONS:   These medications were titrated to patient response per physician's verbal order, Fentanyl 75 mcg IV, Versed 7 mg  DESCRIPTION OF PROCEDURE:   After the risks benefits and alternatives of the procedure were thoroughly explained, informed consent was obtained.  Digital rectal exam was performed and revealed no rectal masses and normal prostate.   The LB 180AL [id number] endoscope was introduced through the anus and advanced to the cecum, which was identified by both the appendix and ileocecal valve, without limitations.  The quality of the prep was excellent, using MoviPrep.  The instrument was then slowly withdrawn as the colon was fully examined. <<PROCEDUREIMAGES>>  FINDINGS:  Three polyps were found. cecum (7 mm flat - cold snare removal), ascending (2 diminutive 2-3 mm removed with biopsy forceps)  Severe diverticulosis was found in the sigmoid colon. This was otherwise a normal examination of the colon. Includes right colon retroflexion.   Retroflexed views in the rectum revealed internal hemorrhoids.    The time to cecum = 1:05 minutes. The scope was then withdrawn in 11:47 minutes from the cecum and the procedure completed. COMPLICATIONS:  None ENDOSCOPIC IMPRESSION: 1) Three right colon polyps removed, largest 7 mm 2) Severe diverticulosis in the sigmoid colon 3) Internal hemorrhoids 4) Otherwise normal examination, excellent prep  REPEAT EXAM:  In for  Colonoscopy, pending biopsy results.  Iva Boop, MD, [name]  CC:  The Patient  n. eSIGNED:   Iva Boop at 05/07/2011 08:57 AM  Louie Boston, [id number]

## 2011-05-07 NOTE — Patient Instructions (Addendum)
Three small polyps were removed. You also have diverticulosis and hemorrhoids (internal). Iva Boop, MD, FACG YOU HAD AN ENDOSCOPIC PROCEDURE TODAY AT THE  ENDOSCOPY CENTER: Refer to the procedure report that was given to you for any specific questions about what was found during the examination.  If the procedure report does not answer your questions, please call your gastroenterologist to clarify.  If you requested that your care partner not be given the details of your procedure findings, then the procedure report has been included in a sealed envelope for you to review at your convenience later.  YOU SHOULD EXPECT: Some feelings of bloating in the abdomen. Passage of more gas than usual.  Walking can help get rid of the air that was put into your GI tract during the procedure and reduce the bloating. If you had a lower endoscopy (such as a colonoscopy or flexible sigmoidoscopy) you may notice spotting of blood in your stool or on the toilet paper. If you underwent a bowel prep for your procedure, then you may not have a normal bowel movement for a few days.  DIET: Your first meal following the procedure should be a light meal and then it is ok to progress to your normal diet.  A half-sandwich or bowl of soup is an example of a good first meal.  Heavy or fried foods are harder to digest and may make you feel nauseous or bloated.  Likewise meals heavy in dairy and vegetables can cause extra gas to form and this can also increase the bloating.  Drink plenty of fluids but you should avoid alcoholic beverages for 24 hours.  ACTIVITY: Your care partner should take you home directly after the procedure.  You should plan to take it easy, moving slowly for the rest of the day.  You can resume normal activity the day after the procedure however you should NOT DRIVE or use heavy machinery for 24 hours (because of the sedation medicines used during the test).    SYMPTOMS TO REPORT IMMEDIATELY: A  gastroenterologist can be reached at any hour.  During normal business hours, 8:30 AM to 5:00 PM Monday through Friday, call [phone].  After hours and on weekends, please call the GI answering service at [phone] who will take a message and have the physician on call contact you.   Following lower endoscopy (colonoscopy or flexible sigmoidoscopy):  Excessive amounts of blood in the stool  Significant tenderness or worsening of abdominal pains  Swelling of the abdomen that is new, acute  Fever of 100F or higher  Following upper endoscopy (EGD)  Vomiting of blood or coffee ground material  New chest pain or pain under the shoulder blades  Painful or persistently difficult swallowing  New shortness of breath  Fever of 100F or higher  Black, tarry-looking stools  FOLLOW UP: If any biopsies were taken you will be contacted by phone or by letter within the next 1-3 weeks.  Call your gastroenterologist if you have not heard about the biopsies in 3 weeks.  Our staff will call the home number listed on your records the next business day following your procedure to check on you and address any questions or concerns that you may have at that time regarding the information given to you following your procedure. This is a courtesy call and so if there is no answer at the home number and we have not heard from you through the emergency physician on call, we will assume  that you have returned to your regular daily activities without incident.  SIGNATURES/CONFIDENTIALITY: You and/or your care partner have signed paperwork which will be entered into your electronic medical record.  These signatures attest to the fact that that the information above on your After Visit Summary has been reviewed and is understood.  Full responsibility of the confidentiality of this discharge information lies with you and/or your care-partner.   Polyp, diverticulosis, high fiber diet and hemorrhoid sheets given  prior to discharge.

## 2011-05-07 NOTE — Progress Notes (Signed)
Patient did not experience any of the following events: a burn prior to discharge; a fall within the facility; wrong site/side/patient/procedure/implant event; or a hospital transfer or hospital admission upon discharge from the facility. (G8907) Patient did not have preoperative order for IV antibiotic SSI prophylaxis. (G8918)  

## 2011-05-08 ENCOUNTER — Telehealth: Payer: Self-pay | Admitting: *Deleted

## 2011-05-08 NOTE — Telephone Encounter (Signed)
Message left

## 2011-05-15 ENCOUNTER — Encounter: Payer: Self-pay | Admitting: Internal Medicine

## 2011-05-15 NOTE — Progress Notes (Signed)
Quick Note:  3 tubular adenomas Routine repeat colon 5 years 2018 ______

## 2011-05-25 ENCOUNTER — Other Ambulatory Visit: Payer: Self-pay | Admitting: Internal Medicine

## 2011-05-26 ENCOUNTER — Other Ambulatory Visit: Payer: Self-pay | Admitting: Internal Medicine

## 2011-05-27 ENCOUNTER — Ambulatory Visit (INDEPENDENT_AMBULATORY_CARE_PROVIDER_SITE_OTHER): Payer: Medicare Other | Admitting: Internal Medicine

## 2011-05-27 VITALS — BP 144/76 | HR 76 | Temp 98.3°F | Wt 242.0 lb

## 2011-05-27 DIAGNOSIS — I10 Essential (primary) hypertension: Secondary | ICD-10-CM

## 2011-05-27 NOTE — Progress Notes (Signed)
Patient ID: Xavier Cook, male   DOB: 08-Apr-1940, 71 y.o.   MRN: [id number] htn---home bps over the past week 128-150/57-72. Tolerating meds wihthout difficulty.  Past Medical History  Diagnosis Date  . Visual floaters     asteroid hyalosis  . Hyperlipidemia   . Hypertension   . Back pain   . Personal history of colonic polyps     History   Social History  . Marital Status: Married    Spouse Name: N/A    Number of Children: N/A  . Years of Education: N/A   Occupational History  . Not on file.   Social History Main Topics  . Smoking status: Former Smoker    Types: Cigarettes    Quit date: 10/26/1995  . Smokeless tobacco: Never Used  . Alcohol Use: No  . Drug Use: No  . Sexually Active: Not on file   Other Topics Concern  . Not on file   Social History Narrative  . No narrative on file    Past Surgical History  Procedure Date  . Cataract extraction, bilateral 2012  . Colonoscopy   . Sigmoidoscopy   . Knee surgery     left arthroscopy  . Upper gastrointestinal endoscopy   . Lumbar disc surgery     w/o myelo  . Nerve blocks   . Tennis elbow surg   . Cholecystectomy   . Knee cartilage surgery     right knee  . Tonsillectomy and adenoidectomy     Family History  Problem Relation Age of Onset  . Stroke Mother   . Hypertension Mother   . Breast cancer Sister   . Colon cancer Neg Hx     Allergies  Allergen Reactions  . Aspirin Itching and Rash    Current Outpatient Prescriptions on File Prior to Visit  Medication Sig Dispense Refill  . amLODipine (NORVASC) 5 MG tablet Take 1 tablet (5 mg total) by mouth daily.  90 tablet  3  . atenolol (TENORMIN) 100 MG tablet Take 100 mg by mouth daily.       . Calcium Carbonate-Vit D-Min (CALTRATE 600+D PLUS) 600-400 MG-UNIT per tablet Take 1 tablet by mouth daily.        Marland Kitchen doxazosin (CARDURA) 4 MG tablet Take 1 tablet (4 mg total) by mouth at bedtime.  90 tablet  3  . lovastatin (MEVACOR) 40 MG tablet TAKE ONE  TABLET BY MOUTH ONCE A DAY  90 tablet  1  . Multiple Vitamin (MULTIVITAMIN) tablet Take 2 tablets by mouth daily.       . NON FORMULARY Herbal fiber blend -Take one daily      . NON FORMULARY Barley Green- Take one daily      . omeprazole (PRILOSEC) 20 MG capsule TAKE ONE CAPSULE  BY MOUTH ONCE A DAY  90 capsule  2  . oxyCODONE-acetaminophen (PERCOCET) 5-325 MG per tablet Take 1 tablet by mouth every 4 (four) hours as needed. Take 1-2 prn pain      . quinapril (ACCUPRIL) 40 MG tablet TAKE ONE TABLET BY MOUTH ONCE A DAY  90 tablet  0     patient denies chest pain, shortness of breath, orthopnea. Denies lower extremity edema, abdominal pain, change in appetite, change in bowel movements. Patient denies rashes, musculoskeletal complaints. No other specific complaints in a complete review of systems.   BP 144/76  Pulse 76  Temp(Src) 98.3 F (36.8 C) (Oral)  Wt 242 lb (109.77 kg)  well-developed well-nourished male in  no acute distress. HEENT exam atraumatic, normocephalic, neck supple without jugular venous distention. Chest clear to auscultation cardiac exam S1-S2 are regular. Abdominal exam overweight with bowel sounds, soft and nontender. Extremities no edema. Neurologic exam is alert with a normal gait.

## 2011-05-27 NOTE — Assessment & Plan Note (Signed)
Bps appear to be getting better at home.  Continue same meds bp check with nurse in 6 weeks

## 2011-06-06 ENCOUNTER — Other Ambulatory Visit: Payer: Self-pay | Admitting: Internal Medicine

## 2011-07-08 ENCOUNTER — Ambulatory Visit (INDEPENDENT_AMBULATORY_CARE_PROVIDER_SITE_OTHER): Payer: Medicare Other | Admitting: Internal Medicine

## 2011-07-08 VITALS — BP 134/72

## 2011-07-08 DIAGNOSIS — I10 Essential (primary) hypertension: Secondary | ICD-10-CM

## 2011-07-09 NOTE — Progress Notes (Signed)
Pt came in for a BP check.  We compared to his BP cuff and his machine was 162/79.  I took BP in both arms and both were normal readings.  Pt also gave [place] copy of BP readings from home.  Per Dr Cato Mulligan pt needs to get new BP cuff and stay on current meds.  Pt aware.

## 2011-09-11 ENCOUNTER — Other Ambulatory Visit: Payer: Self-pay | Admitting: Internal Medicine

## 2012-01-03 ENCOUNTER — Other Ambulatory Visit (INDEPENDENT_AMBULATORY_CARE_PROVIDER_SITE_OTHER): Payer: Medicare Other

## 2012-01-03 DIAGNOSIS — Z79899 Other long term (current) drug therapy: Secondary | ICD-10-CM

## 2012-01-03 DIAGNOSIS — I10 Essential (primary) hypertension: Secondary | ICD-10-CM

## 2012-01-03 DIAGNOSIS — Z0000 Encounter for general adult medical examination without abnormal findings: Secondary | ICD-10-CM

## 2012-01-03 DIAGNOSIS — Z125 Encounter for screening for malignant neoplasm of prostate: Secondary | ICD-10-CM

## 2012-01-03 LAB — HEPATIC FUNCTION PANEL
ALT: 16 U/L (ref 0–53)
AST: 17 U/L (ref 0–37)
Albumin: 3.6 g/dL (ref 3.5–5.2)
Alkaline Phosphatase: 52 U/L (ref 39–117)
Total Protein: 6 g/dL (ref 6.0–8.3)

## 2012-01-03 LAB — BASIC METABOLIC PANEL
BUN: 17 mg/dL (ref 6–23)
CO2: 25 mEq/L (ref 19–32)
Calcium: 8.9 mg/dL (ref 8.4–10.5)
Chloride: 108 mEq/L (ref 96–112)
Creatinine, Ser: 0.9 mg/dL (ref 0.4–1.5)

## 2012-01-03 LAB — CBC WITH DIFFERENTIAL/PLATELET
Basophils Absolute: 0 10*3/uL (ref 0.0–0.1)
Basophils Relative: 0.7 % (ref 0.0–3.0)
Eosinophils Relative: 3.5 % (ref 0.0–5.0)
Hemoglobin: 13.6 g/dL (ref 13.0–17.0)
Lymphocytes Relative: 27.7 % (ref 12.0–46.0)
Monocytes Relative: 8.1 % (ref 3.0–12.0)
Neutro Abs: 3.2 10*3/uL (ref 1.4–7.7)
RBC: 4.59 Mil/uL (ref 4.22–5.81)

## 2012-01-03 LAB — POCT URINALYSIS DIPSTICK
Ketones, UA: NEGATIVE
Protein, UA: NEGATIVE
Spec Grav, UA: 1.025

## 2012-01-03 LAB — LIPID PANEL
LDL Cholesterol: 91 mg/dL (ref 0–99)
Total CHOL/HDL Ratio: 3

## 2012-01-04 ENCOUNTER — Other Ambulatory Visit: Payer: Self-pay | Admitting: Internal Medicine

## 2012-01-10 ENCOUNTER — Ambulatory Visit: Payer: Medicare Other | Admitting: Internal Medicine

## 2012-01-14 ENCOUNTER — Other Ambulatory Visit: Payer: Self-pay | Admitting: *Deleted

## 2012-01-14 MED ORDER — AMLODIPINE BESYLATE 5 MG PO TABS: 5.0000 mg | ORAL_TABLET | Freq: Every day | ORAL | Status: DC

## 2012-01-17 ENCOUNTER — Ambulatory Visit: Payer: Medicare Other | Admitting: Internal Medicine

## 2012-02-01 ENCOUNTER — Other Ambulatory Visit: Payer: Self-pay | Admitting: Internal Medicine

## 2012-03-03 ENCOUNTER — Ambulatory Visit (INDEPENDENT_AMBULATORY_CARE_PROVIDER_SITE_OTHER): Payer: Medicare Other | Admitting: Internal Medicine

## 2012-03-03 ENCOUNTER — Encounter: Payer: Self-pay | Admitting: Internal Medicine

## 2012-03-03 VITALS — BP 150/72 | HR 72 | Temp 98.3°F | Wt 242.0 lb

## 2012-03-03 DIAGNOSIS — Z23 Encounter for immunization: Secondary | ICD-10-CM

## 2012-03-03 DIAGNOSIS — K219 Gastro-esophageal reflux disease without esophagitis: Secondary | ICD-10-CM

## 2012-03-03 DIAGNOSIS — I10 Essential (primary) hypertension: Secondary | ICD-10-CM

## 2012-03-03 DIAGNOSIS — E785 Hyperlipidemia, unspecified: Secondary | ICD-10-CM

## 2012-03-03 NOTE — Assessment & Plan Note (Signed)
CBC:    Component Value Date/Time   WBC 5.4 01/03/2012 0811   HGB 13.6 01/03/2012 0811   HCT 41.0 01/03/2012 0811   PLT 163.0 01/03/2012 0811   MCV 89.3 01/03/2012 0811   NEUTROABS 3.2 01/03/2012 0811   LYMPHSABS 1.5 01/03/2012 0811   MONOABS 0.4 01/03/2012 0811   EOSABS 0.2 01/03/2012 0811   BASOSABS 0.0 01/03/2012 0811    Continue ppi

## 2012-03-03 NOTE — Assessment & Plan Note (Signed)
Labs in November were fine

## 2012-03-03 NOTE — Progress Notes (Signed)
Patient ID: Xavier Maize., male   DOB: 02-01-1941, 72 y.o.   MRN: [id number]   htn-- tolerating meds-- would like to minimize # of meds. Home bps 130/60s  Lipids- tolerating meds  GERD-- no sxs  Past Medical History  Diagnosis Date  . Visual floaters     asteroid hyalosis  . Hyperlipidemia   . Hypertension   . Back pain   . Personal history of colonic polyps     History   Social History  . Marital Status: Married    Spouse Name: N/A    Number of Children: N/A  . Years of Education: N/A   Occupational History  . Not on file.   Social History Main Topics  . Smoking status: Former Smoker    Types: Cigarettes    Quit date: 10/26/1995  . Smokeless tobacco: Never Used  . Alcohol Use: No  . Drug Use: No  . Sexually Active: Not on file   Other Topics Concern  . Not on file   Social History Narrative  . No narrative on file    Past Surgical History  Procedure Date  . Cataract extraction, bilateral 2012  . Colonoscopy   . Sigmoidoscopy   . Knee surgery     left arthroscopy  . Upper gastrointestinal endoscopy   . Lumbar disc surgery     w/o myelo  . Nerve blocks   . Tennis elbow surg   . Cholecystectomy   . Knee cartilage surgery     right knee  . Tonsillectomy and adenoidectomy     Family History  Problem Relation Age of Onset  . Stroke Mother   . Hypertension Mother   . Breast cancer Sister   . Colon cancer Neg Hx     Allergies  Allergen Reactions  . Aspirin Itching and Rash    Current Outpatient Prescriptions on File Prior to Visit  Medication Sig Dispense Refill  . amLODipine (NORVASC) 5 MG tablet Take 1 tablet (5 mg total) by mouth daily.  90 tablet  1  . atenolol (TENORMIN) 100 MG tablet       . Calcium Carbonate-Vit D-Min (CALTRATE 600+D PLUS) 600-400 MG-UNIT per tablet Take 1 tablet by mouth daily.        Marland Kitchen doxazosin (CARDURA) 4 MG tablet TAKE ONE TABLET (4 MG TOTAL) BY MOUTH AT BEDTIME.  90 tablet  2  . lovastatin (MEVACOR) 40 MG  tablet TAKE ONE TABLET BY MOUTH ONCE A DAY  90 tablet  1  . Multiple Vitamin (MULTIVITAMIN) tablet Take 2 tablets by mouth daily.       . NON FORMULARY Herbal fiber blend -Take one daily      . NON FORMULARY Barley Green- Take one daily      . omeprazole (PRILOSEC) 20 MG capsule TAKE ONE CAPSULE  BY MOUTH ONCE A DAY  90 capsule  1  . oxyCODONE-acetaminophen (PERCOCET) 5-325 MG per tablet Take 1 tablet by mouth every 4 (four) hours as needed. Take 1-2 prn pain      . quinapril (ACCUPRIL) 40 MG tablet TAKE ONE TABLET BY MOUTH ONCE A DAY  90 tablet  3     patient denies chest pain, shortness of breath, orthopnea. Denies lower extremity edema, abdominal pain, change in appetite, change in bowel movements. Patient denies rashes, musculoskeletal complaints. No other specific complaints in a complete review of systems.   BP 150/72  Pulse 72  Temp 98.3 F (36.8 C) (Oral)  Wt 242 lb (  109.77 kg)  well-developed well-nourished male in no acute distress. HEENT exam atraumatic, normocephalic, neck supple without jugular venous distention. Chest clear to auscultation cardiac exam S1-S2 are regular. Abdominal exam overweight with bowel sounds, soft and nontender. Extremities no edema. Neurologic exam is alert with a normal gait.

## 2012-03-03 NOTE — Assessment & Plan Note (Signed)
Home bps are fine- no need to change meds

## 2012-03-12 ENCOUNTER — Other Ambulatory Visit: Payer: Self-pay | Admitting: Internal Medicine

## 2012-04-16 ENCOUNTER — Encounter (HOSPITAL_COMMUNITY): Payer: Self-pay | Admitting: Radiology

## 2012-04-16 ENCOUNTER — Inpatient Hospital Stay (HOSPITAL_COMMUNITY)
Admission: EM | Admit: 2012-04-16 | Discharge: 2012-04-20 | DRG: 287 | Disposition: A | Discharge status: Home / Self Care | Payer: Medicare Other | Attending: Internal Medicine | Admitting: Internal Medicine

## 2012-04-16 ENCOUNTER — Observation Stay (HOSPITAL_COMMUNITY): Payer: Medicare Other

## 2012-04-16 ENCOUNTER — Encounter: Payer: Self-pay | Admitting: Family

## 2012-04-16 ENCOUNTER — Ambulatory Visit (INDEPENDENT_AMBULATORY_CARE_PROVIDER_SITE_OTHER): Payer: Medicare Other | Admitting: Family

## 2012-04-16 ENCOUNTER — Other Ambulatory Visit: Payer: Self-pay

## 2012-04-16 ENCOUNTER — Emergency Department (HOSPITAL_COMMUNITY): Payer: Medicare Other

## 2012-04-16 VITALS — BP 142/70 | HR 72 | Wt 242.0 lb

## 2012-04-16 DIAGNOSIS — R21 Rash and other nonspecific skin eruption: Secondary | ICD-10-CM | POA: Diagnosis present

## 2012-04-16 DIAGNOSIS — R079 Chest pain, unspecified: Secondary | ICD-10-CM

## 2012-04-16 DIAGNOSIS — R55 Syncope and collapse: Secondary | ICD-10-CM

## 2012-04-16 DIAGNOSIS — Z860100 Personal history of colon polyps, unspecified: Secondary | ICD-10-CM

## 2012-04-16 DIAGNOSIS — E785 Hyperlipidemia, unspecified: Secondary | ICD-10-CM | POA: Diagnosis present

## 2012-04-16 DIAGNOSIS — I200 Unstable angina: Secondary | ICD-10-CM | POA: Diagnosis present

## 2012-04-16 DIAGNOSIS — K219 Gastro-esophageal reflux disease without esophagitis: Secondary | ICD-10-CM | POA: Diagnosis present

## 2012-04-16 DIAGNOSIS — I10 Essential (primary) hypertension: Secondary | ICD-10-CM | POA: Diagnosis present

## 2012-04-16 DIAGNOSIS — R9439 Abnormal result of other cardiovascular function study: Secondary | ICD-10-CM | POA: Diagnosis not present

## 2012-04-16 DIAGNOSIS — Z823 Family history of stroke: Secondary | ICD-10-CM

## 2012-04-16 DIAGNOSIS — E7800 Pure hypercholesterolemia, unspecified: Secondary | ICD-10-CM

## 2012-04-16 DIAGNOSIS — R42 Dizziness and giddiness: Secondary | ICD-10-CM

## 2012-04-16 DIAGNOSIS — Z87891 Personal history of nicotine dependence: Secondary | ICD-10-CM

## 2012-04-16 DIAGNOSIS — R0609 Other forms of dyspnea: Secondary | ICD-10-CM | POA: Diagnosis present

## 2012-04-16 DIAGNOSIS — I2510 Atherosclerotic heart disease of native coronary artery without angina pectoris: Secondary | ICD-10-CM

## 2012-04-16 DIAGNOSIS — R0602 Shortness of breath: Secondary | ICD-10-CM

## 2012-04-16 DIAGNOSIS — R0600 Dyspnea, unspecified: Secondary | ICD-10-CM

## 2012-04-16 DIAGNOSIS — Z8249 Family history of ischemic heart disease and other diseases of the circulatory system: Secondary | ICD-10-CM

## 2012-04-16 DIAGNOSIS — Z886 Allergy status to analgesic agent status: Secondary | ICD-10-CM

## 2012-04-16 LAB — BASIC METABOLIC PANEL
CO2: 26 mEq/L (ref 19–32)
Calcium: 9 mg/dL (ref 8.4–10.5)
Creatinine, Ser: 0.85 mg/dL (ref 0.50–1.35)
GFR calc Af Amer: >90 mL/min (ref >90)
Sodium: 143 mEq/L (ref 135–145)

## 2012-04-16 LAB — CBC
MCH: 30.1 pg (ref 26.0–34.0)
MCH: 30.7 pg (ref 26.0–34.0)
MCV: 87.7 fL (ref 78.0–100.0)
MCV: 87 fL (ref 78.0–100.0)
Platelets: 163 10*3/uL (ref 150–400)
Platelets: 154 10*3/uL (ref 150–400)
RBC: 4.78 MIL/uL (ref 4.22–5.81)
RDW: 13 % (ref 11.5–15.5)
RDW: 13.1 % (ref 11.5–15.5)
WBC: 7.6 10*3/uL (ref 4.0–10.5)

## 2012-04-16 LAB — POCT I-STAT TROPONIN I
Troponin i, poc: 0 ng/mL (ref 0.00–0.08)
Troponin i, poc: 0 ng/mL (ref 0.00–0.08)

## 2012-04-16 LAB — CREATININE, SERUM: Creatinine, Ser: 0.81 mg/dL (ref 0.50–1.35)

## 2012-04-16 LAB — PRO B NATRIURETIC PEPTIDE: Pro B Natriuretic peptide (BNP): 36.2 pg/mL (ref 0–125)

## 2012-04-16 LAB — HEPATIC FUNCTION PANEL
AST: 18 U/L (ref 0–37)
Bilirubin, Direct: <0.1 mg/dL (ref 0.0–0.3)
Total Bilirubin: 0.4 mg/dL (ref 0.3–1.2)

## 2012-04-16 LAB — D-DIMER, QUANTITATIVE: D-Dimer, Quant: 0.29 ug/mL-FEU (ref 0.00–0.48)

## 2012-04-16 MED ORDER — ASPIRIN EC 81 MG PO TBEC
81.0000 mg | DELAYED_RELEASE_TABLET | Freq: Every day | ORAL | Status: DC
  Administered 2012-04-17 – 2012-04-20 (×4): 81 mg via ORAL
  Filled 2012-04-16 (×6): qty 1

## 2012-04-16 MED ORDER — OXYCODONE-ACETAMINOPHEN 5-325 MG PO TABS
1.0000 | ORAL_TABLET | Freq: Every day | ORAL | Status: DC
  Filled 2012-04-16: qty 1

## 2012-04-16 MED ORDER — ATENOLOL 100 MG PO TABS
100.0000 mg | ORAL_TABLET | Freq: Every day | ORAL | Status: DC
  Filled 2012-04-16 (×2): qty 1

## 2012-04-16 MED ORDER — NITROGLYCERIN 0.3 MG/HR TD PT24
0.3000 mg | MEDICATED_PATCH | Freq: Every day | TRANSDERMAL | Status: DC
  Administered 2012-04-16 – 2012-04-20 (×5): 0.3 mg via TRANSDERMAL
  Filled 2012-04-16 (×7): qty 1

## 2012-04-16 MED ORDER — SIMVASTATIN 20 MG PO TABS
20.0000 mg | ORAL_TABLET | Freq: Every day | ORAL | Status: DC
  Administered 2012-04-16 – 2012-04-19 (×4): 20 mg via ORAL
  Filled 2012-04-16 (×6): qty 1

## 2012-04-16 MED ORDER — MORPHINE SULFATE 2 MG/ML IJ SOLN: 1.0000 mg | INTRAMUSCULAR | Status: DC | PRN

## 2012-04-16 MED ORDER — QUINAPRIL HCL 10 MG PO TABS: 40.0000 mg | ORAL_TABLET | Freq: Every day | ORAL | Status: DC

## 2012-04-16 MED ORDER — GI COCKTAIL ~~LOC~~
30.0000 mL | Freq: Once | ORAL | Status: AC
  Administered 2012-04-16: 30 mL via ORAL
  Filled 2012-04-16: qty 30

## 2012-04-16 MED ORDER — IOHEXOL 350 MG/ML SOLN
100.0000 mL | Freq: Once | INTRAVENOUS | Status: AC | PRN
Start: 2012-04-16 — End: 2012-04-16
  Administered 2012-04-16: 60 mL via INTRAVENOUS

## 2012-04-16 MED ORDER — HEPARIN SODIUM (PORCINE) 5000 UNIT/ML IJ SOLN
5000.0000 [IU] | Freq: Three times a day (TID) | INTRAMUSCULAR | Status: DC
  Administered 2012-04-16 – 2012-04-20 (×11): 5000 [IU] via SUBCUTANEOUS
  Filled 2012-04-16 (×14): qty 1

## 2012-04-16 MED ORDER — DOXAZOSIN MESYLATE 4 MG PO TABS
4.0000 mg | ORAL_TABLET | Freq: Every day | ORAL | Status: DC
  Administered 2012-04-16 – 2012-04-19 (×4): 4 mg via ORAL
  Filled 2012-04-16 (×6): qty 1

## 2012-04-16 MED ORDER — LISINOPRIL 40 MG PO TABS
40.0000 mg | ORAL_TABLET | Freq: Every day | ORAL | Status: DC
  Administered 2012-04-16: 40 mg via ORAL
  Filled 2012-04-16 (×3): qty 1

## 2012-04-16 MED ORDER — PANTOPRAZOLE SODIUM 40 MG PO TBEC
40.0000 mg | DELAYED_RELEASE_TABLET | Freq: Every day | ORAL | Status: DC
  Administered 2012-04-16 – 2012-04-20 (×5): 40 mg via ORAL
  Filled 2012-04-16 (×5): qty 1

## 2012-04-16 MED ORDER — SODIUM CHLORIDE 0.9 % IJ SOLN
3.0000 mL | Freq: Two times a day (BID) | INTRAMUSCULAR | Status: DC
  Administered 2012-04-16: 6 mL via INTRAVENOUS
  Administered 2012-04-17 – 2012-04-19 (×5): 3 mL via INTRAVENOUS

## 2012-04-16 NOTE — ED Notes (Signed)
Dr. Preston Fleeting informed that pt has been in room for 1.5 hours and was questioning when the dr would see him.

## 2012-04-16 NOTE — ED Notes (Signed)
Per EMS- pt has has fatigue and dizziness for 10 days. Pt began having chest pain 3 day. Pt reporting increased fatigue with minor exertion. Pt was seen at PCP today where EMS was called. 20g Left hand. Pt received 2 nitro en route with no change in pain. Pain is in left upper chest and radiates to left arm. Pain is constant today. 146/90. 110/72 after nitro. HR 60. Pt allergic to Asprin so none given.

## 2012-04-16 NOTE — Plan of Care (Signed)
Problem: Consults Goal: Tobacco Cessation referral if indicated Outcome: Not Applicable Date Met:  04/16/12 Pt quit smoking about 15-20 years  Problem: Phase I Progression Outcomes Goal: Anginal pain relieved Outcome: Progressing Pt has been CP free since arriving to the floor

## 2012-04-16 NOTE — ED Notes (Signed)
Pt ambulated, c/o SOB after walking 3/4 of a lap around POD B. Pt o2 100% 2L

## 2012-04-16 NOTE — Progress Notes (Signed)
Subjective:    Patient ID: Xavier Phlegm., male    DOB: February 15, 1941, 73 y.o.   MRN: [id number]  HPI 72 year old white male, nonsmoker, patient of Dr. Cato Mulligan is in today with complaints of dizziness x10 days and worsening. Over the last 3 days he's experience discomfort in his chest that he rates a 5/10 is worse with exertion. 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 This morning, he was winded taken a shower. He has a history of coronary artery disease that was worked up 15 years ago by a cardiologist in [place]. He has a history of hypertension, hyperlipidemia, coronary artery disease. Total cholesterol 162, LDL 91. Compliant with medications.   Review of Systems  Constitutional: Positive for fatigue.  HENT: Negative.   Eyes: Negative.   Respiratory: Positive for shortness of breath.   Cardiovascular: Positive for chest pain. Negative for palpitations and leg swelling.  Gastrointestinal: Negative.   Genitourinary: Negative.   Musculoskeletal: Negative.   Skin: Negative.   Neurological: Positive for dizziness. Negative for syncope, light-headedness and numbness.  Hematological: Negative.   Psychiatric/Behavioral: Negative.    Past Medical History  Diagnosis Date  . Visual floaters     asteroid hyalosis  . Hyperlipidemia   . Hypertension   . Back pain   . Personal history of colonic polyps     History   Social History  . Marital Status: Married    Spouse Name: N/A    Number of Children: N/A  . Years of Education: N/A   Occupational History  . Not on file.   Social History Main Topics  . Smoking status: Former Smoker    Types: Cigarettes    Quit date: 10/26/1995  . Smokeless tobacco: Never Used  . Alcohol Use: No  . Drug Use: No  . Sexually Active: Not on file   Other Topics Concern  . Not on file   Social History Narrative  . No narrative on file    Past Surgical History  Procedure Laterality Date  . Cataract extraction, bilateral   2012  . Colonoscopy    . Sigmoidoscopy    . Knee surgery      left arthroscopy  . Upper gastrointestinal endoscopy    . 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 Nerve blocks    . Tennis elbow surg    . Cholecystectomy    . Knee cartilage surgery      right knee  . Tonsillectomy and adenoidectomy      Family History  Problem Relation Age of Onset  . Stroke Mother   . Hypertension Mother   . Breast cancer Sister   . Colon cancer Neg Hx     Allergies  Allergen Reactions  . Aspirin Itching and Rash    Current Outpatient Prescriptions on File Prior to Visit  Medication Sig Dispense Refill  . amLODipine (NORVASC) 5 MG tablet Take 1 tablet (5 mg total) by mouth daily.  90 tablet  1  . atenolol (TENORMIN) 100 MG tablet       . Calcium Carbonate-Vit D-Min (CALTRATE 600+D PLUS) 600-400 MG-UNIT per tablet Take 1 tablet by mouth daily.        Marland Kitchen doxazosin (CARDURA) 4 MG tablet TAKE ONE TABLET (4 MG TOTAL) BY MOUTH AT BEDTIME.  90 tablet  2  . lovastatin (MEVACOR) 40 MG tablet TAKE ONE TABLET BY MOUTH ONCE A DAY  90 tablet  0  . Multiple  Vitamin (MULTIVITAMIN) tablet Take 2 tablets by mouth daily.       . NON FORMULARY Herbal fiber blend -Take one daily      . NON FORMULARY Barley Green- Take one daily      . omeprazole (PRILOSEC) 20 MG capsule TAKE ONE CAPSULE  BY MOUTH ONCE A DAY  90 capsule  1  . oxyCODONE-acetaminophen (PERCOCET) 5-325 MG per tablet Take 1 tablet by mouth every 4 (four) hours as needed. Take 1-2 prn pain      . quinapril (ACCUPRIL) 40 MG tablet TAKE ONE TABLET BY MOUTH ONCE A DAY  90 tablet  3   No current facility-administered medications on file prior to visit.    BP 142/70  Pulse 72  Wt 242 lb (109.77 kg)  BMI 33.77 kg/m2  SpO2 96%chart    Objective:   Physical Exam  Constitutional: He is oriented to person, place, and time. He appears well-developed and well-nourished.  HENT:  Right Ear: External ear normal.  Left Ear: External ear normal.  Nose: Nose  normal.  Mouth/Throat: Oropharynx is clear and moist.  Neck: Normal range of motion. Neck supple. No thyromegaly present.  Cardiovascular: Normal rate, regular rhythm and normal heart sounds.   No murmur heard. Pulmonary/Chest: Effort normal and breath sounds normal.  Abdominal: Soft. Bowel sounds are normal.  Musculoskeletal: Normal range of motion.  Neurological: He is alert and oriented to person, place, and time. He has normal reflexes.  Skin: Skin is warm and dry.  Psychiatric: He has a normal mood and affect.          Assessment & Plan:  Assessment:   1. Chest pain 2. Coronary artery disease 3. Hyperlipidemia 4. Dizziness   Plan: Consult with Dr. Ladona Ridgel in with our cardiology who suggested patient needs to go to the emergency department as soon as possible. EMS was contacted and patient was transported to [place] [facility].

## 2012-04-16 NOTE — H&P (Signed)
[facility] History and Physical  Xavier Cook. WJX:[id number] DOB: 11-15-1940 DOA: 04/16/2012  Referring physician: Preston Fleeting PCP: Judie Petit, MD  Specialists: none as yet  Chief Complaint: Exertional CP  HPI: Xavier Cook. is a 72 y.o. male who came to the Ed from PCP's office.  He states that he has had some lighteadedness and fatigue and today when he was going to pick up a car from the garage and developed more weakness and ligheadedness-he states he would-he qualifies this as being feeliong like he was about to faint with some shortness of breath.  He is not a current smoker . The patient also complaiend of some CP on the lateral side of the L chest with some radiation down the arm He reports a history of having potentially a cardiac cath about 20-25 years ago which showed a blockage in one vessel but this is not able to be confirmed. He states that he developed the discomfort over the past day more or less continuously-he relates the pain being in the left chest with radiation down the left arm, which was intermittent and was more severe prior to coming on to the ambulance. Patient given 2 nitroglycerin and this relieved his pain to some extent but now the pain is now a 5/10. It is predominantly in the upper left chest.  Review of Systems: The patient denies fever chills nausea vomiting dysuria diarrhea dark stools tarry stool recent travel long travel testosterone supplementation recent smoking drug use rash blurred double vision weakness on any one side of body like a stroke Seizure activity or other issues  Past Medical History  Diagnosis Date  . Visual floaters     asteroid hyalosis  . Hyperlipidemia   . Hypertension   . Back pain   . Personal history of colonic polyps    Past Surgical History  Procedure Laterality Date  . Cataract extraction, bilateral  2012  . Colonoscopy    . Sigmoidoscopy    . Knee surgery      left arthroscopy  . Upper  gastrointestinal endoscopy    . 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 Nerve blocks    . Tennis elbow surg    . Cholecystectomy    . Knee cartilage surgery      right knee  . Tonsillectomy and adenoidectomy     Social History:  reports that he quit smoking about 16 years ago. His smoking use included Cigarettes. He smoked 0.00 packs per day. He has never used smokeless tobacco. He reports that he does not drink alcohol or use illicit drugs. Patient lives at home alone in [place] He is independent at baseline  Allergies  Allergen Reactions  . Aspirin Itching and Rash    Family History  Problem Relation Age of Onset  . Stroke Mother   . Hypertension Mother   . Breast cancer Sister   . Colon cancer Neg Hx    see above Prior to Admission medications   Medication Sig Start Date End Date Taking? Authorizing Provider  amLODipine (NORVASC) 5 MG tablet Take 5 mg by mouth daily.   Yes Historical Provider, MD  atenolol (TENORMIN) 100 MG tablet Take 100 mg by mouth daily.  05/26/11  Yes Bruce Romilda Garret, MD  Calcium Carbonate-Vit D-Min (CALTRATE 600+D PLUS) 600-400 MG-UNIT per tablet Take 1 tablet by mouth every evening.    Yes Historical Provider, MD  doxazosin (CARDURA) 4 MG tablet Take 4 mg  by mouth at bedtime.   Yes Historical Provider, MD  lovastatin (MEVACOR) 40 MG tablet Take 40 mg by mouth every evening.   Yes Historical Provider, MD  Multiple Vitamin (MULTIVITAMIN WITH MINERALS) TABS Take 2 tablets by mouth daily.   Yes Historical Provider, MD  Multiple Vitamins-Minerals (ICAPS PO) Take 1 tablet by mouth 2 (two) times daily.   Yes Historical Provider, MD  NON FORMULARY Take 1 capsule by mouth daily. Herbal fiber blend   Yes Historical Provider, MD  NON FORMULARY Take 5 mLs by mouth daily. Barley Green powder- mix with liquid and drink   Yes Historical Provider, MD  omeprazole (PRILOSEC) 20 MG capsule Take 20 mg by mouth daily.   Yes Historical Provider, MD  oxyCODONE-acetaminophen  (PERCOCET) 5-325 MG per tablet Take 1 tablet by mouth at bedtime. Take 1-2 prn pain   Yes Historical Provider, MD  quinapril (ACCUPRIL) 40 MG tablet Take 40 mg by mouth daily.   Yes Historical Provider, MD   Physical Exam: Filed Vitals:   04/16/12 1545 04/16/12 1600 04/16/12 1615 04/16/12 1645  BP: 128/74 147/65 148/67 156/67  Pulse: 55 58 55 67  Temp:      TempSrc:      Resp: 23 16    SpO2: 96% 99% 97% 98%     General:   alert pleasant oriented slightly anxious male  Eyes:  extraocular movements intact no pallor no icterus  ENT:  throat clear no twisting of the mouth moderate dentition  Neck:  soft supple no thyromegaly no bruit no JVD  Cardiovascular:  S1-S2 no murmur rub or gallop PMI inside left fifth intercostal  Respiratory:  clinically clear  Abdomen:  soft nontender nondistended no rebound  Skin:  no rash  Musculoskeletal:  moves all 4 limbs equally  Psychiatric:  euthymic  Neurologic:  grossly intact  Labs on Admission:  Basic Metabolic Panel:  Recent Labs Lab 04/16/12 1359  NA 143  K 4.6  CL 109  CO2 26  GLUCOSE 106*  BUN 19  CREATININE 0.85  CALCIUM 9.0   Liver Function Tests:  Recent Labs Lab 04/16/12 1539  AST 18  ALT 14  ALKPHOS 63  BILITOT 0.4  PROT 6.6  ALBUMIN 3.6   No results found for this basename: LIPASE, AMYLASE,  in the last 168 hours No results found for this basename: AMMONIA,  in the last 168 hours CBC:  Recent Labs Lab 04/16/12 1359  WBC 7.6  HGB 14.4  HCT 41.9  MCV 87.7  PLT 163   Cardiac Enzymes: No results found for this basename: CKTOTAL, CKMB, CKMBINDEX, TROPONINI,  in the last 168 hours  BNP (last 3 results)  Recent Labs  04/16/12 1539  PROBNP 36.2   CBG: No results found for this basename: GLUCAP,  in the last 168 hours  Radiological Exams on Admission: Dg Chest Port 1 View  04/16/2012  *RADIOLOGY REPORT*  Clinical Data: Chest pain and shortness of breath.  PORTABLE CHEST - 1 VIEW   Comparison: 05/13/2008.  Findings: Trachea is midline.  Heart is enlarged and accentuated by AP semi upright technique.  Lungs are somewhat low in volume with minimal bibasilar subsegmental atelectasis.  Biapical pleural thickening.  No pleural fluid.  IMPRESSION: Low lung volumes with minimal bibasilar subsegmental atelectasis.   Original Report Authenticated By: Leanna Battles, M.D.     EKG: Independently reviewed.  EKG = sinus rhythm PR interval 0.12 with PVCs right bundle branch block no acute ST-T wave elevations  and seemingly unchanged from prior EKG 05/13/2008  Assessment/Plan Principal Problem:   Chest pain Active Problems:   HYPERLIPIDEMIA   HYPERTENSION   GERD   COLONIC POLYPS, HX OF   Dyspnea   reported prior h/o CAD   1. Chest pain-TIMI score = 2. I feel his chest pain certainly is deserving of ruling out for cardiogenic causes- at present time we will hold on consult to Cardiology, cycle cardiac enzymes, a.m. EKG, will consider echocardiogram once CT chest is done.. See below-placed on nitro paste 0.3 mg. 2. Dyspnea-dyspnea is new onset and difficult to determine given he is not very clear historian. He states that he feels dizzy lightheaded and weak and this comes on all of a sudden and had vague times. Even though his d-dimer is low normal limits, his bradycardia is easily explained by his being on a beta blocker. I think it would be reasonable to get a stat CT angiogram of the chest to rule out life-threatening causes of dyspnea and then this can be put to rest as potentially being anxiety related-we will skip orthostatic vital signs to ensure that his "fatigue", and "weakness"are not simply orthostasis which may occur with being on a beta blocker as well as an alpha-blocker for BPH 3. Hypertension-slightly elevated. Continue home medications atenolol 100 mg daily, will hold amlodipine 5 mg daily, Cardura 4 mg daily, quinapril 40 mg daily 4. Low back pain-continue home medications  of Percocet 5/325 one to 2 when necessary pain + morphine IV if needed 5. Possible anxiety-unclear if there any acute stressors. Will need reassessment in the morning   Code Status:  full  Family Communication:  none at bedside  Disposition Plan:  Obs, 1-2 days  Time spent:  60 minutes  [facility] Pager [phone]  If 7PM-7AM, please contact night-coverage [email or web address] Password [facility] 04/16/2012, 5:36 PM

## 2012-04-16 NOTE — ED Provider Notes (Signed)
History     CSN: [id number]  Arrival date & time 04/16/12  1246   First MD Initiated Contact with Patient 04/16/12 1435      Chief Complaint  Patient presents with  . Chest Pain  . Dizziness    (Consider location/radiation/quality/duration/timing/severity/associated sxs/prior treatment) Patient is a 72 y.o. male presenting with chest pain. The history is provided by the patient.  Chest Pain He has noted increasing fatigue over about the last 4 or 5 days. Today, he developed moderate pain in the upper left chest with radiation to left shoulder. He rated pain at 5/10. There is noted dyspnea, nausea, diaphoresis. He has noticed that he feels lightheaded on and fatigued and this is worse with exertion and better with rest. He went to his PCP today who referred him to the ED. In the ambulance, he was given 2 nitroglycerin with complete relief of his pain. He does state that he had a catheterization about 20 years ago and was told that one vessel is partly clogged and that he was being treated medically at that time. He has had no difficulty since then. He normally does aerobic workout 3 times a week.  Past Medical History  Diagnosis Date  . Visual floaters     asteroid hyalosis  . Hyperlipidemia   . Hypertension   . Back pain   . Personal history of colonic polyps     Past Surgical History  Procedure Laterality Date  . Cataract extraction, bilateral  2012  . Colonoscopy    . Sigmoidoscopy    . Knee surgery      left arthroscopy  . Upper gastrointestinal endoscopy    . 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 Nerve blocks    . Tennis elbow surg    . Cholecystectomy    . Knee cartilage surgery      right knee  . Tonsillectomy and adenoidectomy      Family History  Problem Relation Age of Onset  . Stroke Mother   . Hypertension Mother   . Breast cancer Sister   . Colon cancer Neg Hx     History  Substance Use Topics  . Smoking status: Former Smoker    Types: Cigarettes     Quit date: 10/26/1995  . Smokeless tobacco: Never Used  . Alcohol Use: No      Review of Systems  Cardiovascular: Positive for chest pain.  All other systems reviewed and are negative.    Allergies  Aspirin  Home Medications   Current Outpatient Rx  Name  Route  Sig  Dispense  Refill  . amLODipine (NORVASC) 5 MG tablet   Oral   Take 5 mg by mouth daily.         Marland Kitchen atenolol (TENORMIN) 100 MG tablet   Oral   Take 100 mg by mouth daily.          . Calcium Carbonate-Vit D-Min (CALTRATE 600+D PLUS) 600-400 MG-UNIT per tablet   Oral   Take 1 tablet by mouth every evening.          Marland Kitchen doxazosin (CARDURA) 4 MG tablet   Oral   Take 4 mg by mouth at bedtime.         . lovastatin (MEVACOR) 40 MG tablet   Oral   Take 40 mg by mouth every evening.         . Multiple Vitamin (MULTIVITAMIN WITH MINERALS) TABS   Oral   Take  2 tablets by mouth daily.         . Multiple Vitamins-Minerals (ICAPS PO)   Oral   Take 1 tablet by mouth 2 (two) times daily.         . NON FORMULARY   Oral   Take 1 capsule by mouth daily. Herbal fiber blend         . NON FORMULARY   Oral   Take 5 mLs by mouth daily. Barley Green powder- mix with liquid and drink         . omeprazole (PRILOSEC) 20 MG capsule   Oral   Take 20 mg by mouth daily.         Marland Kitchen oxyCODONE-acetaminophen (PERCOCET) 5-325 MG per tablet   Oral   Take 1 tablet by mouth at bedtime. Take 1-2 prn pain         . quinapril (ACCUPRIL) 40 MG tablet   Oral   Take 40 mg by mouth daily.           BP 125/62  Pulse 61  Temp(Src) 98.6 F (37 C) (Oral)  Resp 25  SpO2 98%  Physical Exam  Nursing note and vitals reviewed.  72 year old male, resting comfortably and in no acute distress. Vital signs are significant for tachypnea with respiratory rate of 25. Oxygen saturation is 98%, which is normal. Head is normocephalic and atraumatic. PERRLA, EOMI. Oropharynx is clear. Conjunctivae are pink without  any pallor. Neck is nontender and supple without adenopathy or JVD. Back is nontender and there is no CVA tenderness. Lungs are clear without rales, wheezes, or rhonchi. Chest is nontender. Heart has regular rate and rhythm without murmur. Abdomen is soft, flat, nontender without masses or hepatosplenomegaly and peristalsis is normoactive. Extremities have trace edema, full range of motion is present. Skin is warm and dry without rash. Neurologic: Mental status is normal, cranial nerves are intact, there are no motor or sensory deficits.  ED Course  Procedures (including critical care time)  Results for orders placed during the hospital encounter of 04/16/12  CBC      Result Value Range   WBC 7.6  4.0 - 10.5 K/uL   RBC 4.78  4.22 - 5.81 MIL/uL   Hemoglobin 14.4  13.0 - 17.0 g/dL   HCT 16.1  09.6 - 04.5 %   MCV 87.7  78.0 - 100.0 fL   MCH 30.1  26.0 - 34.0 pg   MCHC 34.4  30.0 - 36.0 g/dL   RDW 40.9  81.1 - 91.4 %   Platelets 163  150 - 400 K/uL  BASIC METABOLIC PANEL      Result Value Range   Sodium 143  135 - 145 mEq/L   Potassium 4.6  3.5 - 5.1 mEq/L   Chloride 109  96 - 112 mEq/L   CO2 26  19 - 32 mEq/L   Glucose, Bld 106 (*) 70 - 99 mg/dL   BUN 19  6 - 23 mg/dL   Creatinine, Ser 7.82  0.50 - 1.35 mg/dL   Calcium 9.0  8.4 - 95.6 mg/dL   GFR calc non Af Amer 84 (*) >90 mL/min   GFR calc Af Amer >90  >90 mL/min  HEPATIC FUNCTION PANEL      Result Value Range   Total Protein 6.6  6.0 - 8.3 g/dL   Albumin 3.6  3.5 - 5.2 g/dL   AST 18  0 - 37 U/L   ALT 14  0 -  53 U/L   Alkaline Phosphatase 63  39 - 117 U/L   Total Bilirubin 0.4  0.3 - 1.2 mg/dL   Bilirubin, Direct <1.6  0.0 - 0.3 mg/dL   Indirect Bilirubin NOT CALCULATED  0.3 - 0.9 mg/dL  D-DIMER, QUANTITATIVE      Result Value Range   D-Dimer, Quant 0.29  0.00 - 0.48 ug/mL-FEU  PRO B NATRIURETIC PEPTIDE      Result Value Range   Pro B Natriuretic peptide (BNP) 36.2  0 - 125 pg/mL  POCT I-STAT TROPONIN I       Result Value Range   Troponin i, poc 0.00  0.00 - 0.08 ng/mL   Comment 3            Dg Chest Port 1 View  04/16/2012  *RADIOLOGY REPORT*  Clinical Data: Chest pain and shortness of breath.  PORTABLE CHEST - 1 VIEW  Comparison: 05/13/2008.  Findings: Trachea is midline.  Heart is enlarged and accentuated by AP semi upright technique.  Lungs are somewhat low in volume with minimal bibasilar subsegmental atelectasis.  Biapical pleural thickening.  No pleural fluid.  IMPRESSION: Low lung volumes with minimal bibasilar subsegmental atelectasis.   Original Report Authenticated By: Leanna Battles, M.D.      Date: 04/16/2012  Rate: 60  Rhythm: normal sinus rhythm and premature ventricular contractions (PVC)  QRS Axis: normal  Intervals: normal  ST/T Wave abnormalities: normal  Conduction Disutrbances:right bundle branch block  Narrative Interpretation: Sinus rhythm with PVC, right bundle branch block. When compared with ECG of 05/13/2008, no significant changes are seen.  Old EKG Reviewed: unchanged    1. Chest pain   2. Exertional dyspnea       MDM  Fatigue and chest pain. There is no ECG evidence of cardiac ischemia, and it is nice have any physical findings to suggest blood loss. Screening labs have been ordered. Of note, he has not been given aspirin because of allergy.  Laboratory workup is unremarkable, chest x-ray is unremarkable. Orthostatic vital signs show no significant changes. On further questioning, patient seems to at describing her dyspnea and then this evening I did have a single episode of chest pain which was relieved with nitroglycerin. BNP will be checked as well as d-dimer.  Above-noted labs are unremarkable. He was ambulated and he did get significant dyspnea on walking about 75 feet, but oxygen saturations remain normal. He does have a history of a partially clogged artery in the remote past. Case will be discussed with hospitalist to consider admission and cardiology  evaluation.  Dr. Mahala Menghini of [facility] agrees to admit the patient.  Dione Booze, MD 04/16/12 1739

## 2012-04-17 ENCOUNTER — Telehealth: Payer: Self-pay | Admitting: Internal Medicine

## 2012-04-17 DIAGNOSIS — R079 Chest pain, unspecified: Secondary | ICD-10-CM

## 2012-04-17 DIAGNOSIS — R0609 Other forms of dyspnea: Secondary | ICD-10-CM

## 2012-04-17 DIAGNOSIS — I2510 Atherosclerotic heart disease of native coronary artery without angina pectoris: Secondary | ICD-10-CM

## 2012-04-17 DIAGNOSIS — K219 Gastro-esophageal reflux disease without esophagitis: Secondary | ICD-10-CM

## 2012-04-17 LAB — CBC
HCT: 41 % (ref 39.0–52.0)
MCHC: 33.4 g/dL (ref 30.0–36.0)
Platelets: 149 10*3/uL — ABNORMAL LOW (ref 150–400)
RDW: 13.2 % (ref 11.5–15.5)

## 2012-04-17 LAB — PROTIME-INR
INR: 0.99 (ref 0.00–1.49)
Prothrombin Time: 13 seconds (ref 11.6–15.2)

## 2012-04-17 LAB — COMPREHENSIVE METABOLIC PANEL
ALT: 13 U/L (ref 0–53)
Alkaline Phosphatase: 64 U/L (ref 39–117)
CO2: 27 mEq/L (ref 19–32)
Chloride: 106 mEq/L (ref 96–112)
GFR calc Af Amer: >90 mL/min (ref >90)
GFR calc non Af Amer: 83 mL/min — ABNORMAL LOW (ref >90)
Glucose, Bld: 108 mg/dL — ABNORMAL HIGH (ref 70–99)
Potassium: 4.5 mEq/L (ref 3.5–5.1)
Sodium: 142 mEq/L (ref 135–145)
Total Bilirubin: 0.9 mg/dL (ref 0.3–1.2)

## 2012-04-17 MED ORDER — ATENOLOL 50 MG PO TABS: 50.0000 mg | ORAL_TABLET | Freq: Every day | ORAL | Status: DC

## 2012-04-17 MED ORDER — OXYCODONE-ACETAMINOPHEN 5-325 MG PO TABS
1.0000 | ORAL_TABLET | Freq: Three times a day (TID) | ORAL | Status: DC | PRN
  Administered 2012-04-17 – 2012-04-19 (×4): 1 via ORAL
  Filled 2012-04-17: qty 1
  Filled 2012-04-17: qty 2
  Filled 2012-04-17 (×2): qty 1
  Filled 2012-04-17: qty 2

## 2012-04-17 NOTE — Progress Notes (Signed)
  Echocardiogram 2D Echocardiogram has been performed.  Xavier Cook 04/17/2012, 10:57 AM

## 2012-04-17 NOTE — Progress Notes (Signed)
MD on call notified of pt having 2.54 second pauses and PVCs. Mg lab ordered. Pt asymptomatic & asleep. Will continue to monitor the pt. Sanda Linger

## 2012-04-17 NOTE — Progress Notes (Signed)
Utilization review completed.  

## 2012-04-17 NOTE — Telephone Encounter (Signed)
Patient's daughter called stating that he was admitted to the hospital [facility] Rm 3002 on yesterday and she would like a call as the patient is unsure as to why he is in the hospital. Please assist.

## 2012-04-17 NOTE — Progress Notes (Signed)
[facility] PROGRESS NOTE  Xavier Cook. ION:[id number] DOB: June 10, 1940 DOA: 04/16/2012 PCP: Judie Petit, MD  Assessment/Plan:  1. Chest pain-TIMI score = 2. Cardiac enzymes have been negative. D dimer negative. Ekg does not show ischemic changes. Currently chest pain free. Cardiology consultation called for further recommendations. Resume aspirin 81 mg daily, atenolol held for his 2.54 pause overnight. zocor and lisinopril ( held for borderline BP) Echocardiogram is pending.  2. Dyspnea-dyspnea is new onset . No orthopnea or PND. Does not appear to be fluid overloaded. Pro bnp is 36. D dimer is negative. CT angio of the chest negative for PE.  3. Hypertension-bp better controlled. Resume home medicatons.  4. Low back pain-continue home medications of Percocet 5/325 one to 2 when necessary pain + morphine IV if needed 5. Possible anxiety-currently stable.  6. DVT prophylaxis      Code Status: full code Family Communication: daughter at bedside Disposition Plan: PENDING.   Consultants:  Cardiology consult from [facility]  HPI/Subjective:   Objective: Filed Vitals:   04/17/12 0500 04/17/12 0503 04/17/12 0506 04/17/12 0509  BP: 111/63 110/58 101/55 96/51  Pulse: 59 67 80   Temp: 97.4 F (36.3 C)     TempSrc: Oral     Resp: 20 20    Height:      Weight:      SpO2: 93% 95% 94%    No intake or output data in the 24 hours ending 04/17/12 0958 Filed Weights   04/16/12 1910  Weight: 108.092 kg (238 lb 4.8 oz)    Exam: Cardiovascular: S1-S2 no murmur rub or gallop PMI inside left fifth intercostal  Respiratory: clinically clear  Abdomen: soft nontender nondistended no rebound  Skin: no rash  Musculoskeletal: moves all 4 limbs equally     Data Reviewed: Basic Metabolic Panel:  Recent Labs Lab 04/16/12 1359 04/16/12 2007  NA 143  --   K 4.6  --   CL 109  --   CO2 26  --   GLUCOSE 106*  --   BUN 19  --   CREATININE 0.85 0.81  CALCIUM 9.0  --     Liver Function Tests:  Recent Labs Lab 04/16/12 1539  AST 18  ALT 14  ALKPHOS 63  BILITOT 0.4  PROT 6.6  ALBUMIN 3.6   No results found for this basename: LIPASE, AMYLASE,  in the last 168 hours No results found for this basename: AMMONIA,  in the last 168 hours CBC:  Recent Labs Lab 04/16/12 1359 04/16/12 2007 04/17/12 0833  WBC 7.6 6.9 6.9  HGB 14.4 14.1 13.7  HCT 41.9 40.0 41.0  MCV 87.7 87.0 87.4  PLT 163 154 149*   Cardiac Enzymes:  Recent Labs Lab 04/16/12 2008 04/17/12 0146  TROPONINI <0.30 <0.30   BNP (last 3 results)  Recent Labs  04/16/12 1539  PROBNP 36.2   CBG: No results found for this basename: GLUCAP,  in the last 168 hours  No results found for this or any previous visit (from the past 240 hour(s)).   Studies: Ct Angio Chest Pe W/cm &/or Wo Cm  04/16/2012  *RADIOLOGY REPORT*  Clinical Data: Shortness of breath.  Weakness.  CT ANGIOGRAPHY CHEST  Technique:  Multidetector CT imaging of the chest using the standard protocol during bolus administration of intravenous contrast. Multiplanar reconstructed images including MIPs were obtained and reviewed to evaluate the vascular anatomy.  Contrast: 60mL OMNIPAQUE IOHEXOL 350 MG/ML SOLN  Comparison: Chest x-ray dated 04/16/2012 and  chest CT dated 01/26/2007  Findings: There are no pulmonary emboli.  The lungs are clear.  No effusions.  No significant osseous abnormality.  The visualized portion of the upper abdomen is normal.  IMPRESSION: Normal exam.  Specifically, no pulmonary emboli.   Original Report Authenticated By: Francene Boyers, M.D.    Dg Chest Port 1 View  04/16/2012  *RADIOLOGY REPORT*  Clinical Data: Chest pain and shortness of breath.  PORTABLE CHEST - 1 VIEW  Comparison: 05/13/2008.  Findings: Trachea is midline.  Heart is enlarged and accentuated by AP semi upright technique.  Lungs are somewhat low in volume with minimal bibasilar subsegmental atelectasis.  Biapical pleural thickening.   No pleural fluid.  IMPRESSION: Low lung volumes with minimal bibasilar subsegmental atelectasis.   Original Report Authenticated By: Leanna Battles, M.D.     Scheduled Meds: . aspirin EC  81 mg Oral Daily  . atenolol  100 mg Oral Daily  . doxazosin  4 mg Oral QHS  . heparin  5,000 Units Subcutaneous Q8H  . lisinopril  40 mg Oral Daily  . nitroGLYCERIN  0.3 mg Transdermal Daily  . oxyCODONE-acetaminophen  1 tablet Oral QHS  . pantoprazole  40 mg Oral Daily  . simvastatin  20 mg Oral q1800  . sodium chloride  3 mL Intravenous Q12H   Continuous Infusions:   Principal Problem:   Chest pain Active Problems:   HYPERLIPIDEMIA   HYPERTENSION   GERD   COLONIC POLYPS, HX OF   Dyspnea   reported prior h/o CAD        [facility]  [facility] Pager [phone]. If 8PM-8AM, please contact night-coverage at [email or web address], password [facility] 04/17/2012, 9:58 AM  LOS: 1 day

## 2012-04-17 NOTE — Consult Note (Signed)
[facility]       CONSULTATION NOTE   Reason for Consult: Chest pain  Requesting Physician: Dr. Mahala Menghini  Cardiologist: None  HPI: This is a 72 y.o. male with a past medical history significant for hypertension, hyperlipidemia, and possible cardiac catheterization about 20 years ago which demonstrated a blockage in one vessel.  Over the past day or so he presented with complaints of left-sided chest pain which radiated down the left arm.  He is also felt fatigue somewhat short of breath and presyncopal.  He had an episode of weakness yesterday when driving to pick up his car from a garage.  He presented to his primary care doctor's office and was given nitroglycerin which partially relieved his chest pain.  He was then referred to the emergency department.  We were consulted to evaluate his chest pain.  Of note the patient had sinus pauses of 2.5 seconds overnight and PVCs.  He was asymptomatic.  PMHx:  Past Medical History  Diagnosis Date  . Visual floaters     asteroid hyalosis  . Hyperlipidemia   . Hypertension   . Back pain   . Personal history of colonic polyps    Past Surgical History  Procedure Laterality Date  . Cataract extraction, bilateral  2012  . Colonoscopy    . Sigmoidoscopy    . Knee surgery      left arthroscopy  . Upper gastrointestinal endoscopy    . 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 Nerve blocks    . Tennis elbow surg    . Cholecystectomy    . Knee cartilage surgery      right knee  . Tonsillectomy and adenoidectomy      FAMHx: Family History  Problem Relation Age of Onset  . Stroke Mother   . Hypertension Mother   . Breast cancer Sister   . Colon cancer Neg Hx     SOCHx:  reports that he quit smoking about 16 years ago. His smoking use included Cigarettes. He smoked 0.00 packs per day. He has never used smokeless tobacco. He reports that he does not drink alcohol or use illicit  drugs.  ALLERGIES: Allergies  Allergen Reactions  . Aspirin Itching and Rash    ROS: A comprehensive review of systems was negative except for: Constitutional: positive for fatigue Cardiovascular: positive for dyspnea, exertional chest pressure/discomfort and near-syncope Gastrointestinal: positive for nausea and reflux symptoms Neurological: positive for dizziness and weakness  HOME MEDICATIONS: Prescriptions prior to admission  Medication Sig Dispense Refill  . amLODipine (NORVASC) 5 MG tablet Take 5 mg by mouth daily.      Marland Kitchen atenolol (TENORMIN) 100 MG tablet Take 100 mg by mouth daily.       . Calcium Carbonate-Vit D-Min (CALTRATE 600+D PLUS) 600-400 MG-UNIT per tablet Take 1 tablet by mouth every evening.       Marland Kitchen doxazosin (CARDURA) 4 MG tablet Take 4 mg by mouth at bedtime.      . lovastatin (MEVACOR) 40 MG tablet Take 40 mg by mouth every evening.      . Multiple Vitamin (MULTIVITAMIN WITH MINERALS) TABS Take 2 tablets by mouth daily.      . Multiple Vitamins-Minerals (ICAPS PO) Take 1 tablet by mouth 2 (two) times daily.      . NON FORMULARY Take 1 capsule by mouth daily. Herbal fiber blend      . NON FORMULARY Take 5  mLs by mouth daily. Barley Green powder- mix with liquid and drink      . omeprazole (PRILOSEC) 20 MG capsule Take 20 mg by mouth daily.      Marland Kitchen oxyCODONE-acetaminophen (PERCOCET) 5-325 MG per tablet Take 1 tablet by mouth at bedtime. Take 1-2 prn pain      . quinapril (ACCUPRIL) 40 MG tablet Take 40 mg by mouth daily.        HOSPITAL MEDICATIONS: Prior to Admission:  Prescriptions prior to admission  Medication Sig Dispense Refill  . amLODipine (NORVASC) 5 MG tablet Take 5 mg by mouth daily.      Marland Kitchen atenolol (TENORMIN) 100 MG tablet Take 100 mg by mouth daily.       . Calcium Carbonate-Vit D-Min (CALTRATE 600+D PLUS) 600-400 MG-UNIT per tablet Take 1 tablet by mouth every evening.       Marland Kitchen doxazosin (CARDURA) 4 MG tablet Take 4 mg by mouth at bedtime.      .  lovastatin (MEVACOR) 40 MG tablet Take 40 mg by mouth every evening.      . Multiple Vitamin (MULTIVITAMIN WITH MINERALS) TABS Take 2 tablets by mouth daily.      . Multiple Vitamins-Minerals (ICAPS PO) Take 1 tablet by mouth 2 (two) times daily.      . NON FORMULARY Take 1 capsule by mouth daily. Herbal fiber blend      . NON FORMULARY Take 5 mLs by mouth daily. Barley Green powder- mix with liquid and drink      . omeprazole (PRILOSEC) 20 MG capsule Take 20 mg by mouth daily.      Marland Kitchen oxyCODONE-acetaminophen (PERCOCET) 5-325 MG per tablet Take 1 tablet by mouth at bedtime. Take 1-2 prn pain      . quinapril (ACCUPRIL) 40 MG tablet Take 40 mg by mouth daily.        VITALS: Blood pressure 96/51, pulse 80, temperature 97.4 F (36.3 C), temperature source Oral, resp. rate 20, height 5\' 11"  (1.803 m), weight 108.092 kg (238 lb 4.8 oz), SpO2 94.00%.  PHYSICAL EXAM: General appearance: alert and no distress Neck: no adenopathy, no carotid bruit, no JVD, supple, symmetrical, trachea midline and thyroid not enlarged, symmetric, no tenderness/mass/nodules Lungs: clear to auscultation bilaterally Heart: regular rate and rhythm, S1, S2 normal, no murmur, click, rub or gallop Abdomen: soft, non-tender; bowel sounds normal; no masses,  no organomegaly Extremities: extremities normal, atraumatic, no cyanosis or edema Pulses: 2+ and symmetric Skin: Skin color, texture, turgor normal. No rashes or lesions or multiple tattoos Neurologic: Alert and oriented X 3, normal strength and tone. Normal symmetric reflexes. Normal coordination and gait  LABS: Results for orders placed during the hospital encounter of 04/16/12 (from the past 48 hour(s))  CBC     Status: None   Collection Time    04/16/12  1:59 PM      Result Value Range   WBC 7.6  4.0 - 10.5 K/uL   RBC 4.78  4.22 - 5.81 MIL/uL   Hemoglobin 14.4  13.0 - 17.0 g/dL   HCT 16.1  09.6 - 04.5 %   MCV 87.7  78.0 - 100.0 fL   MCH 30.1  26.0 - 34.0 pg    MCHC 34.4  30.0 - 36.0 g/dL   RDW 40.9  81.1 - 91.4 %   Platelets 163  150 - 400 K/uL  BASIC METABOLIC PANEL     Status: Abnormal   Collection Time    04/16/12  1:59  PM      Result Value Range   Sodium 143  135 - 145 mEq/L   Potassium 4.6  3.5 - 5.1 mEq/L   Chloride 109  96 - 112 mEq/L   CO2 26  19 - 32 mEq/L   Glucose, Bld 106 (*) 70 - 99 mg/dL   BUN 19  6 - 23 mg/dL   Creatinine, Ser 5.40  0.50 - 1.35 mg/dL   Calcium 9.0  8.4 - 98.1 mg/dL   GFR calc non Af Amer 84 (*) >90 mL/min   GFR calc Af Amer >90  >90 mL/min   Comment:            The eGFR has been calculated     using the CKD EPI equation.     This calculation has not been     validated in all clinical     situations.     eGFR's persistently     <90 mL/min signify     possible Chronic Kidney Disease.  POCT I-STAT TROPONIN I     Status: None   Collection Time    04/16/12  2:14 PM      Result Value Range   Troponin i, poc 0.00  0.00 - 0.08 ng/mL   Comment 3            Comment: Due to the release kinetics of cTnI,     a negative result within the first hours     of the onset of symptoms does not rule out     myocardial infarction with certainty.     If myocardial infarction is still suspected,     repeat the test at appropriate intervals.  HEPATIC FUNCTION PANEL     Status: None   Collection Time    04/16/12  3:39 PM      Result Value Range   Total Protein 6.6  6.0 - 8.3 g/dL   Albumin 3.6  3.5 - 5.2 g/dL   AST 18  0 - 37 U/L   ALT 14  0 - 53 U/L   Alkaline Phosphatase 63  39 - 117 U/L   Total Bilirubin 0.4  0.3 - 1.2 mg/dL   Bilirubin, Direct <1.9  0.0 - 0.3 mg/dL   Indirect Bilirubin NOT CALCULATED  0.3 - 0.9 mg/dL  D-DIMER, QUANTITATIVE     Status: None   Collection Time    04/16/12  3:39 PM      Result Value Range   D-Dimer, Quant 0.29  0.00 - 0.48 ug/mL-FEU   Comment:            AT THE INHOUSE ESTABLISHED CUTOFF     VALUE OF 0.48 ug/mL FEU,     THIS ASSAY HAS BEEN DOCUMENTED     IN THE LITERATURE  TO HAVE     A SENSITIVITY AND NEGATIVE     PREDICTIVE VALUE OF AT LEAST     98 TO 99%.  THE TEST RESULT     SHOULD BE CORRELATED WITH     AN ASSESSMENT OF THE CLINICAL     PROBABILITY OF DVT / VTE.  PRO B NATRIURETIC PEPTIDE     Status: None   Collection Time    04/16/12  3:39 PM      Result Value Range   Pro B Natriuretic peptide (BNP) 36.2  0 - 125 pg/mL  POCT I-STAT TROPONIN I     Status: None   Collection Time    04/16/12  5:36 PM      Result Value Range   Troponin i, poc 0.00  0.00 - 0.08 ng/mL   Comment 3            Comment: Due to the release kinetics of cTnI,     a negative result within the first hours     of the onset of symptoms does not rule out     myocardial infarction with certainty.     If myocardial infarction is still suspected,     repeat the test at appropriate intervals.  CBC     Status: None   Collection Time    04/16/12  8:07 PM      Result Value Range   WBC 6.9  4.0 - 10.5 K/uL   RBC 4.60  4.22 - 5.81 MIL/uL   Hemoglobin 14.1  13.0 - 17.0 g/dL   HCT 40.9  81.1 - 91.4 %   MCV 87.0  78.0 - 100.0 fL   MCH 30.7  26.0 - 34.0 pg   MCHC 35.3  30.0 - 36.0 g/dL   RDW 78.2  95.6 - 21.3 %   Platelets 154  150 - 400 K/uL  CREATININE, SERUM     Status: Abnormal   Collection Time    04/16/12  8:07 PM      Result Value Range   Creatinine, Ser 0.81  0.50 - 1.35 mg/dL   GFR calc non Af Amer 86 (*) >90 mL/min   GFR calc Af Amer >90  >90 mL/min   Comment:            The eGFR has been calculated     using the CKD EPI equation.     This calculation has not been     validated in all clinical     situations.     eGFR's persistently     <90 mL/min signify     possible Chronic Kidney Disease.  TROPONIN I     Status: None   Collection Time    04/16/12  8:08 PM      Result Value Range   Troponin I <0.30  <0.30 ng/mL   Comment:            Due to the release kinetics of cTnI,     a negative result within the first hours     of the onset of symptoms does not  rule out     myocardial infarction with certainty.     If myocardial infarction is still suspected,     repeat the test at appropriate intervals.  TROPONIN I     Status: None   Collection Time    04/17/12  1:46 AM      Result Value Range   Troponin I <0.30  <0.30 ng/mL   Comment:            Due to the release kinetics of cTnI,     a negative result within the first hours     of the onset of symptoms does not rule out     myocardial infarction with certainty.     If myocardial infarction is still suspected,     repeat the test at appropriate intervals.  CBC     Status: Abnormal   Collection Time    04/17/12  8:33 AM      Result Value Range   WBC 6.9  4.0 - 10.5 K/uL   RBC 4.69  4.22 - 5.81 MIL/uL  Hemoglobin 13.7  13.0 - 17.0 g/dL   HCT 16.1  09.6 - 04.5 %   MCV 87.4  78.0 - 100.0 fL   MCH 29.2  26.0 - 34.0 pg   MCHC 33.4  30.0 - 36.0 g/dL   RDW 40.9  81.1 - 91.4 %   Platelets 149 (*) 150 - 400 K/uL  PROTIME-INR     Status: None   Collection Time    04/17/12  8:33 AM      Result Value Range   Prothrombin Time 13.0  11.6 - 15.2 seconds   INR 0.99  0.00 - 1.49    IMAGING: Ct Angio Chest Pe W/cm &/or Wo Cm  04/16/2012  *RADIOLOGY REPORT*  Clinical Data: Shortness of breath.  Weakness.  CT ANGIOGRAPHY CHEST  Technique:  Multidetector CT imaging of the chest using the standard protocol during bolus administration of intravenous contrast. Multiplanar reconstructed images including MIPs were obtained and reviewed to evaluate the vascular anatomy.  Contrast: 60mL OMNIPAQUE IOHEXOL 350 MG/ML SOLN  Comparison: Chest x-ray dated 04/16/2012 and chest CT dated 01/26/2007  Findings: There are no pulmonary emboli.  The lungs are clear.  No effusions.  No significant osseous abnormality.  The visualized portion of the upper abdomen is normal.  IMPRESSION: Normal exam.  Specifically, no pulmonary emboli.   Original Report Authenticated By: Francene Boyers, M.D.    Dg Chest Port 1  View  04/16/2012  *RADIOLOGY REPORT*  Clinical Data: Chest pain and shortness of breath.  PORTABLE CHEST - 1 VIEW  Comparison: 05/13/2008.  Findings: Trachea is midline.  Heart is enlarged and accentuated by AP semi upright technique.  Lungs are somewhat low in volume with minimal bibasilar subsegmental atelectasis.  Biapical pleural thickening.  No pleural fluid.  IMPRESSION: Low lung volumes with minimal bibasilar subsegmental atelectasis.   Original Report Authenticated By: Leanna Battles, M.D.     IMPRESSION: 1. Dizziness/imbalance - could be cardiac (?ACS -> troponin negative x 2), neurocardiogenic (stroke, TIA), arryhtmia, or vascular (carotid/vertebral disease). 2. Left chest pain -> arm, concerning for angina, however, typical and atypical features. D-dimer negative. CT angiogram negative for PE, however, there is coronary calcium of the LAD and LCX vessels.  3. Dyspnea - he reports extreme fatigue and dyspnea with little movement.  BNP is very low at 36.2.  Plan for echocardiogram today already in progress.  RECOMMENDATION: 1. The patient has multiple complaints including left chest and left arm pain, dizziness, imbalance, presyncope and extreme fatigue and dyspnea with exertion.  According to him although these symptoms are new.  He states that he previously saw a cardiologist in [place] who told him he had a blocked artery but did not recommend further treatment, almost 20 years ago.  His CT scan demonstrates coronary calcium, a surrogate for coronary artery disease.  I would recommend that he undergo nuclear stress testing tomorrow to look for possible reversible ischemia.  In addition he had a 2-1/2 second pause with PVC's noted overnight.  I wonder if there is an arrhythmia that is possibly causing his symptoms.  I would certainly consider 30 day monitoring after discharge if there is not an obvious treatable arrhythmia noted during this hospitalization.  There is also concern for possible  sleep apnea and this should be evaluated as well.  Finally, blood pressure is noted to be slightly low today.  I would favor holding his lisinopril and continuing topical nitrates as he is chest pain-free.  Thanks for the consult.  Will  follow along closely with you.  Time Spent Directly with Patient:  30 minutes  Chrystie Nose, MD, [facility] Attending Cardiologist [facility]  Saisha Hogue C 04/17/2012, 9:52 AM

## 2012-04-18 ENCOUNTER — Observation Stay (HOSPITAL_COMMUNITY): Payer: Medicare Other

## 2012-04-18 DIAGNOSIS — I10 Essential (primary) hypertension: Secondary | ICD-10-CM

## 2012-04-18 MED ORDER — TECHNETIUM TC 99M SESTAMIBI GENERIC - CARDIOLITE
10.0000 | Freq: Once | INTRAVENOUS | Status: AC | PRN
  Administered 2012-04-18: 10 via INTRAVENOUS

## 2012-04-18 MED ORDER — TECHNETIUM TC 99M SESTAMIBI GENERIC - CARDIOLITE
30.0000 | Freq: Once | INTRAVENOUS | Status: AC | PRN
  Administered 2012-04-18: 30 via INTRAVENOUS

## 2012-04-18 MED ORDER — METOPROLOL TARTRATE 12.5 MG HALF TABLET
12.5000 mg | ORAL_TABLET | Freq: Two times a day (BID) | ORAL | Status: DC
  Administered 2012-04-18 – 2012-04-20 (×4): 12.5 mg via ORAL
  Filled 2012-04-18 (×5): qty 1

## 2012-04-18 MED ORDER — REGADENOSON 0.4 MG/5ML IV SOLN
0.4000 mg | Freq: Once | INTRAVENOUS | Status: AC
  Administered 2012-04-18: 0.4 mg via INTRAVENOUS

## 2012-04-18 NOTE — Progress Notes (Signed)
I have seen and evaluated the patient this PM along with Wilburt Finlay, PA. I agree with his findings, examination as well as impression recommendations.  He has ruled out for myocardial infarction, however he is Myoview stress test indicated apical and lateral ischemia, which in the setting of a patient with new onset dyspnea on exertion and left arm and/chest pain I am concerned that this is indeed unstable angina. His echocardiogram is relatively normal, and his vital signs are relatively stable. He has had some tachycardia and his blood pressure now 149 or 76, I do agree with starting a low-dose beta blocker. His artery on a statin and aspirin.  He's not had any further discomfort in his chest or arm since been on nitroglycerin paste.  After reviewing the findings for stress test in discussing his symptoms, we determine the best course of action we be to proceed with cardiac catheterization for definitive diagnosis. Will plan for catheterization via the right radial approach on Monday morning.  I have reviewed the risks benefits returns indications of the procedure with the patient. Risks / Complications include, but not limited to: Death, MI, CVA/TIA, VF/VT (with defibrillation), Bradycardia (need for temporary pacer placement), contrast induced nephropathy, bleeding / bruising / hematoma / pseudoaneurysm, vascular or coronary injury (with possible emergent CT or Vascular Surgery), adverse medication reactions, infection.   He did voice understanding and agree to proceed. We'll provide video education and we'll have him sign consent form to be cosigned/verified tomorrow.  As is not having active pain we do not need to anticoagulate.   Marykay Lex, M.D., M.S. [facility] [address] [place], [place]  [address]  [phone] Pager # [phone] 04/18/2012 4:27 PM

## 2012-04-18 NOTE — Progress Notes (Signed)
[facility]  Subjective: No further CP  Objective: Vital signs in last 24 hours: Temp:  [98.1 F (36.7 C)-98.7 F (37.1 C)] 98.1 F (36.7 C) (02/22 0435) Pulse Rate:  [60-88] 88 (02/22 0928) Resp:  [20] 20 (02/21 1400) BP: (107-148)/(54-69) 148/69 mmHg (02/22 0928) SpO2:  [94 %-96 %] 94 % (02/22 0435) Weight:  [107.775 kg (237 lb 9.6 oz)] 107.775 kg (237 lb 9.6 oz) (02/22 0435) Last BM Date: 04/17/12  Intake/Output from previous day: 02/21 0701 - 02/22 0700 In: 840 [P.O.:840] Out: -  Intake/Output this shift:    Medications Current Facility-Administered Medications  Medication Dose Route Frequency Provider Last Rate Last Dose  . aspirin EC tablet 81 mg  81 mg Oral Daily Rhetta Mura, MD   81 mg at 04/17/12 1143  . doxazosin (CARDURA) tablet 4 mg  4 mg Oral QHS Rhetta Mura, MD   4 mg at 04/17/12 2228  . heparin injection 5,000 Units  5,000 Units Subcutaneous Q8H Rhetta Mura, MD   5,000 Units at 04/18/12 [phone]  . morphine 2 MG/ML injection 1 mg  1 mg Intravenous Q3H PRN Rhetta Mura, MD      . nitroGLYCERIN (NITRODUR - Dosed in mg/24 hr) patch 0.3 mg  0.3 mg Transdermal Daily Rhetta Mura, MD   0.3 mg at 04/17/12 1009  . oxyCODONE-acetaminophen (PERCOCET/ROXICET) 5-325 MG per tablet 1-2 tablet  1-2 tablet Oral Q8H PRN Kathlen Mody, MD   1 tablet at 04/18/12 0651  . pantoprazole (PROTONIX) EC tablet 40 mg  40 mg Oral Daily Rhetta Mura, MD   40 mg at 04/17/12 1006  . simvastatin (ZOCOR) tablet 20 mg  20 mg Oral q1800 Rhetta Mura, MD   20 mg at 04/17/12 1856  . sodium chloride 0.9 % injection 3 mL  3 mL Intravenous Q12H Rhetta Mura, MD   3 mL at 04/17/12 2228    PE: General appearance: alert, cooperative and no distress Lungs: clear to auscultation bilaterally Heart: regular rate and rhythm, S1, S2 normal, no murmur, click, rub or gallop Extremities: Trace LEE Pulses: 2+ and  symmetric Neurologic: Grossly normal  Lab Results:   Recent Labs  04/16/12 1359 04/16/12 2007 04/17/12 0833  WBC 7.6 6.9 6.9  HGB 14.4 14.1 13.7  HCT 41.9 40.0 41.0  PLT 163 154 149*   BMET  Recent Labs  04/16/12 1359 04/16/12 2007 04/17/12 0833  NA 143  --  142  K 4.6  --  4.5  CL 109  --  106  CO2 26  --  27  GLUCOSE 106*  --  108*  BUN 19  --  16  CREATININE 0.85 0.81 0.88  CALCIUM 9.0  --  9.2   PT/INR  Recent Labs  04/17/12 0833  LABPROT 13.0  INR 0.99   Cardiac Panel (last 3 results)  Recent Labs  04/16/12 2008 04/17/12 0146 04/17/12 0832  TROPONINI <0.30 <0.30 <0.30   Lipid Panel     Component Value Date/Time   CHOL 162 01/03/2012 0811   TRIG 82.0 01/03/2012 0811   HDL 54.70 01/03/2012 0811   CHOLHDL 3 01/03/2012 0811   VLDL 16.4 01/03/2012 0811   LDLCALC 91 01/03/2012 0811   Study Conclusions  - Left ventricle: The cavity size was normal. There was moderate concentric hypertrophy. Systolic function was normal. The estimated ejection fraction was in the range of 60% to 65%. 2 false tendons noted coarsing the mid-ventricle (normal variant) and at the apex. Abnormal  septal bounce (from the RBBB). Doppler parameters are consistent with abnormal left ventricular relaxation (grade 1 diastolic dysfunction). The E/e' ratio is >10, suggesting elevated LV filling pressure. - Aortic valve: Structurally normal valve. Trileaflet. Cusp separation was normal. - Left atrium: Severely dilated (45 ml/m2). - Right atrium: The atrium was mildly dilated. - Inferior vena cava: The vessel was normal in size; the respirophasic diameter changes were in the normal range (= 50%); findings are consistent with normal central venous pressure.    Assessment/Plan   Principal Problem:   Chest pain Active Problems:   HYPERLIPIDEMIA   HYPERTENSION   GERD   COLONIC POLYPS, HX OF   Dyspnea   reported prior h/o CAD  Plan:  Ruled out for MI.  Lipid panel looks  good.  He tolerated the Lexiscan fairly well.  Symptoms of SOB which resolved at ~11mins. The estimated ejection fraction was in the range of 60% to 65%. 2 false tendons noted coarsing the mid-ventricle (normal variant) and at the apex. Abnormal septal bounce (from the RBBB). Doppler parameters are consistent with abnormal left ventricular relaxation (grade 1 diastolic dysfunction). The E/e' ratio is >10, suggesting elevated LV filling pressure.  Severely dilated LA.   LOS: 2 days    Iolani Twilley 04/18/2012 9:50 AM

## 2012-04-18 NOTE — Progress Notes (Signed)
[facility] PROGRESS NOTE  Xavier Cook. ZOX:[id number] DOB: [birth year] DOA: 04/16/2012 PCP: Judie Petit, MD  Assessment/Plan:  1. Chest pain-TIMI score = 2. Cardiac enzymes have been negative. D dimer negative. Ekg does not show ischemic changes. Currently chest pain free. Cardiology consultation called for further recommendations. Resume aspirin 81 mg daily, atenolol held for his 2.54 pause overnight. zocor and lisinopril ( held for borderline BP) Echocardiogram showed normal EF. Stress test today showed apical and lateral ischemia. He is scheduled for cardiac cath on Monday.  2. Dyspnea-dyspnea is new onset . No orthopnea or PND. Does not appear to be fluid overloaded. Pro bnp is 36. D dimer is negative. CT angio of the chest negative for PE.  3. Hypertension-bp better controlled. Resume home medicatons.  4. Low back pain-continue home medications of Percocet 5/325 one to 2 when necessary pain + morphine IV if needed 5. Possible anxiety-currently stable.  6. DVT prophylaxis      Code Status: full code Family Communication: daughter at bedside Disposition Plan: PENDING.   Consultants:  Cardiology consult from [facility]  HPI/Subjective:   Objective: Filed Vitals:   04/18/12 0928 04/18/12 0954 04/18/12 0956 04/18/12 0958  BP: 148/69 143/58 139/64 112/67  Pulse: 88 83 103 89  Temp:      TempSrc:      Resp:      Height:      Weight:      SpO2:        Intake/Output Summary (Last 24 hours) at 04/18/12 1328 Last data filed at 04/17/12 1700  Gross per 24 hour  Intake    240 ml  Output      0 ml  Net    240 ml   Filed Weights   04/16/12 1910 04/18/12 0435  Weight: 108.092 kg (238 lb 4.8 oz) 107.775 kg (237 lb 9.6 oz)    Exam: Cardiovascular: S1-S2 no murmur rub or gallop PMI inside left fifth intercostal  Respiratory: clinically clear  Abdomen: soft nontender nondistended no rebound  Skin: no rash  Musculoskeletal: moves all 4 limbs  equally     Data Reviewed: Basic Metabolic Panel:  Recent Labs Lab 04/16/12 1359 04/16/12 2007 04/17/12 0833  NA 143  --  142  K 4.6  --  4.5  CL 109  --  106  CO2 26  --  27  GLUCOSE 106*  --  108*  BUN 19  --  16  CREATININE 0.85 0.81 0.88  CALCIUM 9.0  --  9.2  MG  --   --  2.0   Liver Function Tests:  Recent Labs Lab 04/16/12 1539 04/17/12 0833  AST 18 14  ALT 14 13  ALKPHOS 63 64  BILITOT 0.4 0.9  PROT 6.6 6.3  ALBUMIN 3.6 3.5   No results found for this basename: LIPASE, AMYLASE,  in the last 168 hours No results found for this basename: AMMONIA,  in the last 168 hours CBC:  Recent Labs Lab 04/16/12 1359 04/16/12 2007 04/17/12 0833  WBC 7.6 6.9 6.9  HGB 14.4 14.1 13.7  HCT 41.9 40.0 41.0  MCV 87.7 87.0 87.4  PLT 163 154 149*   Cardiac Enzymes:  Recent Labs Lab 04/16/12 2008 04/17/12 0146 04/17/12 0832  TROPONINI <0.30 <0.30 <0.30   BNP (last 3 results)  Recent Labs  04/16/12 1539  PROBNP 36.2   CBG: No results found for this basename: GLUCAP,  in the last 168 hours  No results found for this  or any previous visit (from the past 240 hour(s)).   Studies: Ct Angio Chest Pe W/cm &/or Wo Cm  04/16/2012  *RADIOLOGY REPORT*  Clinical Data: Shortness of breath.  Weakness.  CT ANGIOGRAPHY CHEST  Technique:  Multidetector CT imaging of the chest using the standard protocol during bolus administration of intravenous contrast. Multiplanar reconstructed images including MIPs were obtained and reviewed to evaluate the vascular anatomy.  Contrast: 60mL OMNIPAQUE IOHEXOL 350 MG/ML SOLN  Comparison: Chest x-ray dated 04/16/2012 and chest CT dated 01/26/2007  Findings: There are no pulmonary emboli.  The lungs are clear.  No effusions.  No significant osseous abnormality.  The visualized portion of the upper abdomen is normal.  IMPRESSION: Normal exam.  Specifically, no pulmonary emboli.   Original Report Authenticated By: Francene Boyers, M.D.    Nm  Myocar Multi W/spect W/wall Motion / Ef  04/18/2012  *RADIOLOGY REPORT*  Clinical Data:  Chest pain and history of hyperlipidemia and hypertension.  MYOCARDIAL IMAGING WITH SPECT (REST AND PHARMACOLOGIC-STRESS) GATED LEFT VENTRICULAR WALL MOTION STUDY LEFT VENTRICULAR EJECTION FRACTION  Technique:  Standard myocardial SPECT imaging was performed after resting intravenous injection of 10 mCi Tc-26m sestamibi. Subsequently, intravenous infusion of Lexiscan was performed under the supervision of the Cardiology staff.  At peak effect of the drug, 30 mCi Tc-71m sestamibi was injected intravenously and standard myocardial SPECT  imaging was performed.  Quantitative gated imaging was also performed to evaluate left ventricular wall motion, and estimate left ventricular ejection fraction.  Comparison:  None.  Findings: Utilizing gated data, the end-diastolic volume is estimated to be 92 ml and the end-systolic volume 35 ml. Calculated ejection fraction is 62%.  Gated wall motion analysis is within normal limits.  SPECT imaging shows a focal and relatively small reversible perfusion defect involving the left ventricular apex and extending into the distal lateral wall.  This may be consistent with ischemia in this distribution.  No evidence of fixed perfusion defects.  IMPRESSION:  1.  Normal left ventricular function with calculated ejection fraction of 62%. 2.  Suggestion of small territory of reversible and inducible ischemia involving the left ventricular apex and distal lateral wall.   Original Report Authenticated By: Irish Lack, M.D.    Dg Chest Port 1 View  04/16/2012  *RADIOLOGY REPORT*  Clinical Data: Chest pain and shortness of breath.  PORTABLE CHEST - 1 VIEW  Comparison: 05/13/2008.  Findings: Trachea is midline.  Heart is enlarged and accentuated by AP semi upright technique.  Lungs are somewhat low in volume with minimal bibasilar subsegmental atelectasis.  Biapical pleural thickening.  No pleural  fluid.  IMPRESSION: Low lung volumes with minimal bibasilar subsegmental atelectasis.   Original Report Authenticated By: Leanna Battles, M.D.     Scheduled Meds: . aspirin EC  81 mg Oral Daily  . doxazosin  4 mg Oral QHS  . heparin  5,000 Units Subcutaneous Q8H  . nitroGLYCERIN  0.3 mg Transdermal Daily  . pantoprazole  40 mg Oral Daily  . simvastatin  20 mg Oral q1800  . sodium chloride  3 mL Intravenous Q12H   Continuous Infusions:   Principal Problem:   Chest pain Active Problems:   HYPERLIPIDEMIA   HYPERTENSION   GERD   COLONIC POLYPS, HX OF   Dyspnea   reported prior h/o CAD        [facility]  [facility] Pager [phone]. If 8PM-8AM, please contact night-coverage at [email or web address], password [facility] 04/18/2012, 1:28 PM  LOS: 2 days

## 2012-04-19 DIAGNOSIS — R9439 Abnormal result of other cardiovascular function study: Secondary | ICD-10-CM | POA: Diagnosis not present

## 2012-04-19 MED ORDER — SODIUM CHLORIDE 0.9 % IJ SOLN
3.0000 mL | Freq: Two times a day (BID) | INTRAMUSCULAR | Status: DC
  Administered 2012-04-19: 3 mL via INTRAVENOUS

## 2012-04-19 MED ORDER — ASPIRIN 81 MG PO CHEW: 324.0000 mg | CHEWABLE_TABLET | ORAL | Status: DC

## 2012-04-19 MED ORDER — SODIUM CHLORIDE 0.9 % IV SOLN: 250.0000 mL | INTRAVENOUS | Status: DC | PRN

## 2012-04-19 MED ORDER — SODIUM CHLORIDE 0.9 % IJ SOLN: 3.0000 mL | INTRAMUSCULAR | Status: DC | PRN

## 2012-04-19 MED ORDER — DIAZEPAM 5 MG PO TABS
5.0000 mg | ORAL_TABLET | ORAL | Status: AC
  Administered 2012-04-20: 5 mg via ORAL
  Filled 2012-04-19: qty 1

## 2012-04-19 MED ORDER — SODIUM CHLORIDE 0.9 % IV SOLN
1.0000 mL/kg/h | INTRAVENOUS | Status: DC
  Administered 2012-04-20: 1 mL/kg/h via INTRAVENOUS

## 2012-04-19 NOTE — Progress Notes (Signed)
[facility] Patient ID: Xavier Hailes., male   DOB: [birth year], 72 y.o.   MRN: [id number]   Subjective: No further CP.  No SOB or dizziness.   Resting comfortably. Watched Cath video with daughter last PM - no questions.  Objective: Vital signs in last 24 hours: Temp:  [97.7 F (36.5 C)-98.6 F (37 C)] 98.6 F (37 C) (02/23 0500) Pulse Rate:  [63-95] 95 (02/23 0506) Resp:  [16-20] 16 (02/23 0500) BP: (117-159)/(68-80) 121/77 mmHg (02/23 0506) SpO2:  [92 %-97 %] 92 % (02/23 0500) Last BM Date: 04/17/12  Intake/Output from previous day:   Intake/Output this shift: Total I/O In: 483 [P.O.:480; I.V.:3] Out: -   Medications Current Facility-Administered Medications  Medication Dose Route Frequency Provider Last Rate Last Dose  . 0.9 %  sodium chloride infusion  250 mL Intravenous PRN Marykay Lex, MD      . Melene Muller ON 04/21/2012] 0.9 %  sodium chloride infusion  1 mL/kg/hr Intravenous Continuous Marykay Lex, MD      . Melene Muller ON 04/21/2012] aspirin chewable tablet 324 mg  324 mg Oral Pre-Cath Marykay Lex, MD      . aspirin EC tablet 81 mg  81 mg Oral Daily Rhetta Mura, MD   81 mg at 04/19/12 0934  . [START ON 04/20/2012] diazepam (VALIUM) tablet 5 mg  5 mg Oral On Call Marykay Lex, MD      . doxazosin (CARDURA) tablet 4 mg  4 mg Oral QHS Rhetta Mura, MD   4 mg at 04/18/12 2124  . heparin injection 5,000 Units  5,000 Units Subcutaneous Q8H Rhetta Mura, MD   5,000 Units at 04/19/12 0636  . metoprolol tartrate (LOPRESSOR) tablet 12.5 mg  12.5 mg Oral BID Wilburt Finlay, PA   12.5 mg at 04/19/12 0934  . morphine 2 MG/ML injection 1 mg  1 mg Intravenous Q3H PRN Rhetta Mura, MD      . nitroGLYCERIN (NITRODUR - Dosed in mg/24 hr) patch 0.3 mg  0.3 mg Transdermal Daily Rhetta Mura, MD   0.3 mg at 04/19/12 0937  . oxyCODONE-acetaminophen (PERCOCET/ROXICET) 5-325 MG per tablet 1-2 tablet  1-2 tablet Oral Q8H  PRN Kathlen Mody, MD   1 tablet at 04/18/12 2005  . pantoprazole (PROTONIX) EC tablet 40 mg  40 mg Oral Daily Rhetta Mura, MD   40 mg at 04/19/12 0934  . simvastatin (ZOCOR) tablet 20 mg  20 mg Oral q1800 Rhetta Mura, MD   20 mg at 04/18/12 1717  . sodium chloride 0.9 % injection 3 mL  3 mL Intravenous Q12H Rhetta Mura, MD   3 mL at 04/19/12 0937  . sodium chloride 0.9 % injection 3 mL  3 mL Intravenous Q12H Marykay Lex, MD      . sodium chloride 0.9 % injection 3 mL  3 mL Intravenous PRN Marykay Lex, MD        PE: General appearance: alert, cooperative and no distress Lungs: clear to auscultation bilaterally, non-labored. Heart: regular rate and rhythm, S1, S2 normal, no murmur, click, rub or gallop Extremities: Trace LEE Pulses: 2+ and symmetric Neurologic: Grossly normal  Lab Results:   Recent Labs  04/16/12 1359 04/16/12 2007 04/17/12 0833  WBC 7.6 6.9 6.9  HGB 14.4 14.1 13.7  HCT 41.9 40.0 41.0  PLT 163 154 149*   BMET  Recent Labs  04/16/12 1359 04/16/12 2007 04/17/12 0833  NA 143  --  142  K 4.6  --  4.5  CL 109  --  106  CO2 26  --  27  GLUCOSE 106*  --  108*  BUN 19  --  16  CREATININE 0.85 0.81 0.88  CALCIUM 9.0  --  9.2   PT/INR  Recent Labs  04/17/12 0833  LABPROT 13.0  INR 0.99   Cardiac Panel (last 3 results)  Recent Labs  04/16/12 2008 04/17/12 0146 04/17/12 0832  TROPONINI <0.30 <0.30 <0.30   Lipid Panel     Component Value Date/Time   CHOL 162 01/03/2012 0811   TRIG 82.0 01/03/2012 0811   HDL 54.70 01/03/2012 0811   CHOLHDL 3 01/03/2012 0811   VLDL 16.4 01/03/2012 0811   LDLCALC 91 01/03/2012 0811   ECHO - EF 6-65%, mod Conc LVH, Grade 1 Diastolic dysfunction (abnormal relaxation); Severe LA dilation.  Mild RA dilation. MYOVIEW: IMPRESSION:  1. Normal left ventricular function with calculated ejection fraction of 62%.  2. Suggestion of small territory of reversible and inducible ischemia involving the  left ventricular apex and distal lateral wall.   Assessment/Plan   Principal Problem:   Unstable angina pectoris Active Problems:   HYPERTENSION   Dyspnea on exertion   Abnormal nuclear stress test   HYPERLIPIDEMIA   GERD   COLONIC POLYPS, HX OF   reported prior h/o CAD  Plan:  Ruled out for MI.  Lipid panel looks good.  He tolerated the Lexiscan fairly well - but anpical & lateral ischemia noted.   HR & BP improved with addition of BB.  Continue NTG paste.  Plan LHC/Angio in AM for SSx of Chest pain - consistent with Unstable Angina & Abnormal Myoview.  Statin  PPI & SQ Heparin (unless recurrence of CP).    LOS: 3 days    Xavier Cook 04/19/2012 11:20 AM

## 2012-04-19 NOTE — Progress Notes (Signed)
[facility] PROGRESS NOTE  Xavier Cook. WUJ:[id number] DOB: [birth year] DOA: 04/16/2012 PCP: Judie Petit, MD  Assessment/Plan:  1. Chest pain-TIMI score = 2. Cardiac enzymes have been negative. D dimer negative. Ekg does not show ischemic changes. Currently chest pain free. Cardiology consultation called for further recommendations. Resume aspirin 81 mg daily, atenolol held for his 2.54 pause overnight. zocor and lisinopril ( held for borderline BP) Echocardiogram showed normal EF. Stress test today showed apical and lateral ischemia. He is scheduled for cardiac cath on Monday.  2. Dyspnea-dyspnea is new onset . No orthopnea or PND. Does not appear to be fluid overloaded. Pro bnp is 36. D dimer is negative. CT angio of the chest negative for PE.  3. Hypertension-bp better controlled. Resume home medicatons.  4. Low back pain-continue home medications of Percocet 5/325 one to 2 when necessary pain + morphine IV if needed 5. Possible anxiety-currently stable.  6. DVT prophylaxis      Code Status: full code Family Communication:none at bedside Disposition Plan: PENDING cath tomorrw.    Consultants:  Cardiology consult from [facility]  HPI/Subjective: Denies chest pain, sob, or palpitations. He reports insomnia , and is not requesting any sleep aids at this time.   Objective: Filed Vitals:   04/18/12 2100 04/19/12 0500 04/19/12 0503 04/19/12 0506  BP: 147/79 117/73 120/68 121/77  Pulse: 63 81 78 95  Temp: 97.7 F (36.5 C) 98.6 F (37 C)    TempSrc:      Resp: 16 16    Height:      Weight:      SpO2: 93% 92%     No intake or output data in the 24 hours ending 04/19/12 0936 Filed Weights   04/16/12 1910 04/18/12 0435  Weight: 108.092 kg (238 lb 4.8 oz) 107.775 kg (237 lb 9.6 oz)    Exam: alert afebrile comfortable.  Cardiovascular: S1-S2 no murmur rub or gallop PMI inside left fifth intercostal  Respiratory: clinically clear  Abdomen: soft nontender  nondistended no rebound  Skin: no rash  Musculoskeletal: moves all 4 limbs equally     Data Reviewed: Basic Metabolic Panel:  Recent Labs Lab 04/16/12 1359 04/16/12 2007 04/17/12 0833  NA 143  --  142  K 4.6  --  4.5  CL 109  --  106  CO2 26  --  27  GLUCOSE 106*  --  108*  BUN 19  --  16  CREATININE 0.85 0.81 0.88  CALCIUM 9.0  --  9.2  MG  --   --  2.0   Liver Function Tests:  Recent Labs Lab 04/16/12 1539 04/17/12 0833  AST 18 14  ALT 14 13  ALKPHOS 63 64  BILITOT 0.4 0.9  PROT 6.6 6.3  ALBUMIN 3.6 3.5   No results found for this basename: LIPASE, AMYLASE,  in the last 168 hours No results found for this basename: AMMONIA,  in the last 168 hours CBC:  Recent Labs Lab 04/16/12 1359 04/16/12 2007 04/17/12 0833  WBC 7.6 6.9 6.9  HGB 14.4 14.1 13.7  HCT 41.9 40.0 41.0  MCV 87.7 87.0 87.4  PLT 163 154 149*   Cardiac Enzymes:  Recent Labs Lab 04/16/12 2008 04/17/12 0146 04/17/12 0832  TROPONINI <0.30 <0.30 <0.30   BNP (last 3 results)  Recent Labs  04/16/12 1539  PROBNP 36.2   CBG: No results found for this basename: GLUCAP,  in the last 168 hours  No results found for this or  any previous visit (from the past 240 hour(s)).   Studies: Nm Myocar Multi W/spect W/wall Motion / Ef  04/18/2012  *RADIOLOGY REPORT*  Clinical Data:  Chest pain and history of hyperlipidemia and hypertension.  MYOCARDIAL IMAGING WITH SPECT (REST AND PHARMACOLOGIC-STRESS) GATED LEFT VENTRICULAR WALL MOTION STUDY LEFT VENTRICULAR EJECTION FRACTION  Technique:  Standard myocardial SPECT imaging was performed after resting intravenous injection of 10 mCi Tc-71m sestamibi. Subsequently, intravenous infusion of Lexiscan was performed under the supervision of the Cardiology staff.  At peak effect of the drug, 30 mCi Tc-45m sestamibi was injected intravenously and standard myocardial SPECT  imaging was performed.  Quantitative gated imaging was also performed to evaluate left  ventricular wall motion, and estimate left ventricular ejection fraction.  Comparison:  None.  Findings: Utilizing gated data, the end-diastolic volume is estimated to be 92 ml and the end-systolic volume 35 ml. Calculated ejection fraction is 62%.  Gated wall motion analysis is within normal limits.  SPECT imaging shows a focal and relatively small reversible perfusion defect involving the left ventricular apex and extending into the distal lateral wall.  This may be consistent with ischemia in this distribution.  No evidence of fixed perfusion defects.  IMPRESSION:  1.  Normal left ventricular function with calculated ejection fraction of 62%. 2.  Suggestion of small territory of reversible and inducible ischemia involving the left ventricular apex and distal lateral wall.   Original Report Authenticated By: Irish Lack, M.D.     Scheduled Meds: . aspirin EC  81 mg Oral Daily  . doxazosin  4 mg Oral QHS  . heparin  5,000 Units Subcutaneous Q8H  . metoprolol tartrate  12.5 mg Oral BID  . nitroGLYCERIN  0.3 mg Transdermal Daily  . pantoprazole  40 mg Oral Daily  . simvastatin  20 mg Oral q1800  . sodium chloride  3 mL Intravenous Q12H   Continuous Infusions:   Principal Problem:   Chest pain Active Problems:   HYPERLIPIDEMIA   HYPERTENSION   GERD   COLONIC POLYPS, HX OF   Dyspnea   reported prior h/o CAD        [facility]  [facility] Pager [phone]. If 8PM-8AM, please contact night-coverage at [email or web address], password [facility] 04/19/2012, 9:36 AM  LOS: 3 days

## 2012-04-20 ENCOUNTER — Encounter (HOSPITAL_COMMUNITY)
Admission: EM | Disposition: A | Discharge status: Home / Self Care | Payer: Self-pay | Source: Home / Self Care | Attending: Internal Medicine

## 2012-04-20 HISTORY — PX: LEFT HEART CATHETERIZATION WITH CORONARY ANGIOGRAM: SHX5451

## 2012-04-20 LAB — BASIC METABOLIC PANEL
Calcium: 9 mg/dL (ref 8.4–10.5)
GFR calc Af Amer: >90 mL/min (ref >90)
GFR calc non Af Amer: 81 mL/min — ABNORMAL LOW (ref >90)
Sodium: 141 mEq/L (ref 135–145)

## 2012-04-20 LAB — CBC
MCH: 29.7 pg (ref 26.0–34.0)
MCHC: 34.4 g/dL (ref 30.0–36.0)
Platelets: 138 10*3/uL — ABNORMAL LOW (ref 150–400)
RDW: 12.7 % (ref 11.5–15.5)

## 2012-04-20 LAB — PROTIME-INR: Prothrombin Time: 12.1 seconds (ref 11.6–15.2)

## 2012-04-20 SURGERY — LEFT HEART CATHETERIZATION WITH CORONARY ANGIOGRAM
Anesthesia: LOCAL

## 2012-04-20 MED ORDER — SODIUM CHLORIDE 0.9 % IV SOLN: 1.0000 mL/kg/h | INTRAVENOUS | Status: DC

## 2012-04-20 MED ORDER — NITROGLYCERIN 1 MG/10 ML FOR IR/CATH LAB
INTRA_ARTERIAL | Status: AC
  Filled 2012-04-20: qty 10

## 2012-04-20 MED ORDER — MIDAZOLAM HCL 2 MG/2ML IJ SOLN
INTRAMUSCULAR | Status: AC
  Filled 2012-04-20: qty 2

## 2012-04-20 MED ORDER — FENTANYL CITRATE 0.05 MG/ML IJ SOLN
INTRAMUSCULAR | Status: AC
  Filled 2012-04-20: qty 2

## 2012-04-20 MED ORDER — ASPIRIN 81 MG PO TBEC: 81.0000 mg | DELAYED_RELEASE_TABLET | Freq: Every day | ORAL | Status: DC

## 2012-04-20 MED ORDER — LIDOCAINE HCL (PF) 1 % IJ SOLN
INTRAMUSCULAR | Status: AC
  Filled 2012-04-20: qty 30

## 2012-04-20 MED ORDER — HEPARIN (PORCINE) IN NACL 2-0.9 UNIT/ML-% IJ SOLN
INTRAMUSCULAR | Status: AC
  Filled 2012-04-20: qty 1000

## 2012-04-20 MED ORDER — VERAPAMIL HCL 2.5 MG/ML IV SOLN
INTRAVENOUS | Status: AC
  Filled 2012-04-20: qty 2

## 2012-04-20 NOTE — CV Procedure (Signed)
[facility] CARDIAC CATHETERIZATION REPORT  NAME:  Xavier Cook.   MRN: [id number] DOB:  1941-01-02     ADMIT DATE: 04/16/2012 Procedure Date: 04/20/2012   INTERVENTIONAL CARDIOLOGIST: Marykay Lex, M.D., MS PRIMARY CARE PROVIDER: Judie Petit, MD PRIMARY CARDIOLOGIST: Hilty, K C ([place]), MD  PATIENT:  Xavier Cook. is a 72 y.o. male with a PMH of HTN, HLD who presented with dyspnea on exertion and L chest - arm pain.  He was evaluated with a Myoview that suggested apical and lateral ischemia.  He is referred for cardiac catheterization.  PRE-OPERATIVE DIAGNOSIS:    Chest pain - concern for unstable angina  Abnormal Myoview Stress Test  PROCEDURES PERFORMED:    Left Heart Catheterization with Native Coronary Angiography  Left Ventriculography  PROCEDURE:Consent:  Risks of procedure as well as the alternatives and risks of each were explained to the (patient/caregiver).  Consent for procedure obtained. Consent for signed by MD and patient with RN witness -- placed on chart.   PROCEDURE: The patient was brought to the 2nd Floor [facility] in the fasting state and prepped and draped in the usual sterile fashion for Right RADIAL access.  A modified Allen's test with plethysmography demonstrated excellent ulnar collateralization. Sterile technique was used including antiseptics, cap, gloves, gown, hand hygiene, mask and sheet.  Skin prep: Chlorhexidine.  Time Out: Verified patient identification, verified procedure, site/side was marked, verified correct patient position, special equipment/implants available, medications/allergies/relevent history reviewed, required imaging and test results available.  Performed  Access: Right Radial Artery; 5 Fr Sheath - Seldinger technique using Angiocath Micropuncture Kit Diagnostic:  TIG 4.0 advanced over Versicore wire to ascending aorta; exchanges done over Safety J wire as TIG  was not successful for coronary engagement.  Was used to cross Aortic Valve.  Left Coronary Artery Angiography: JL 3.5  Right Coronary Artery Angiography: JR4  LV Hemodynamics (LV Gram): TIG 4.0; Angled Pigtail  TR Band:  1145 Hours, 14 mL air - initial plethysmography revealed occlusive hemostatis.  ANESTHESIA:   Local Lidocaine 2 ml SEDATION:  2 mg IV Versed, 50 mcg IV fentanyl ; Premedication: 5 mg PO Valium MEDICATIONS: Omnipaque 80 mg  Anticoagulation: IV Heparin 5500 Units  Hemodynamics:  Central Aortic / Mean Pressures: 153/72 mmHg; 104 mmHg  Left Ventricular Pressures / EDP: 153/7 mmHg; 15-19 mmHg  Left Ventriculography:  EF: 60-65 %  Wall Motion: Normal  Coronary Anatomy:  Left Main: Very large caliber, long trunk that bifurcates into the LAD and Circumflex, mild luminal irregularities LAD: Large-caliber vessel that wraps around the apex is diffuse 20-30% lesions in the proximal mid vessel. It gives rise to one major diagonal branch and a smaller direct second branch. No significant angiographic CAD.  D1: Small moderate caliber vessel, ostial 20%. The vessel bifurcates into several side branches.  Minimal luminal irregularities beyond the ostial lesion  D2: Small caliber vessel; minimal luminal irregularities Left Circumflex: Large-caliber vessel that gives off what looks like a very proximal Ramus Intermedius/ OM1 then bifurcates into OM 2 and the AV groove circumflex that terminates and small posterior lateral branches. Minimal luminal irregularities in the main circumflex system.  OM1/Ramus Intermedius: Moderate large-caliber vessel with diffuse tendon 20% stenoses proximally before the vessel bifurcates on the anterolateral wall.  OM 2: Moderate caliber vessel with a proximal 40% eccentric lesion. The vessel then bifurcates distally along the inferolateral wall.  RCA: Large-caliber dominant vessel with mild luminal irregularities but no  significant  lesions.  RPDA: Large-caliber vessel with multiple septal perforators it reaches almost the apex. Minimal luminal irregularities  RPL Sysytem:The Right Posterior AV Groove Branch small-caliber vessel terminating as a small right posterior lateral branch  EBL:   < 10 ml  PATIENT DISPOSITION:    The patient was transferred to the PACU holding area in a hemodynamicaly stable, chest pain free condition.  He'll be Transferred to the Short Stay unit for post catheterization monitoring, anticipate discharge later on today.  The patient tolerated the procedure well, and there were no complications.  The patient was stable before, during, and after the procedure.  POST-OPERATIVE DIAGNOSIS:    Only mild Coronary disease by angiographic evaluation. No obstructive lesions noted.  Normal left ventricular function with mildly elevated LVEDP.  PLAN OF CARE:  Standard post radial cath care. Anticipate discharge later on today after bedrest per primary service. Text and Dr. Blake Divine.  Medical therapy for cardiac risk factors.  He likely would need increased blood pressure control to assist with afterload reduction given his mildly elevated BP.  He'll follow up at [facility] for post catheterization monitoring.  Would probably discharge him his home dose of beta blocker given the extent of his PVCs on telemetry.   I discussed the results the case with patient's daughter. I notified the primary service and results for study as well.   Marykay Lex, M.D., M.S. [facility] [address] [place], [place]  [address]  [phone]  04/20/2012 12:04 PM

## 2012-04-20 NOTE — Discharge Summary (Signed)
Physician Discharge Summary  Xavier Cook. ZOX:[id number] DOB: 05/03/1940 DOA: 04/16/2012  PCP: Judie Petit, MD  Admit date: 04/16/2012 Discharge date: 04/20/2012    Recommendations for Outpatient Follow-up:  Follow up with PCP and cardiology as recommended.  Discharge Diagnoses:  Principal Problem:   Chest pain with minimal risk for cardiac etiology Active Problems:   HYPERLIPIDEMIA   HYPERTENSION   GERD   COLONIC POLYPS, HX OF   Dyspnea on exertion   reported prior h/o CAD   Abnormal nuclear stress test   Discharge Condition: stable  Diet recommendation: low sodium diet  Filed Weights   04/16/12 1910 04/18/12 0435  Weight: 108.092 kg (238 lb 4.8 oz) 107.775 kg (237 lb 9.6 oz)    History of present illness:  Xavier Cook. is a 72 y.o. male who came to the Ed from PCP's office. He states that he has had some lighteadedness and fatigue and today when he was going to pick up a car from the garage and developed more weakness and ligheadedness-he states he would-he qualifies this as being feeliong like he was about to faint with some shortness of breath. He is not a current smoker .  The patient also complaiend of some CP on the lateral side of the L chest with some radiation down the arm  He reports a history of having potentially a cardiac cath about 20-25 years ago which showed a blockage in one vessel but this is not able to be confirmed.  He states that he developed the discomfort over the past day more or less continuously-he relates the pain being in the left chest with radiation down the left arm, which was intermittent and was more severe prior to coming on to the ambulance. Patient given 2 nitroglycerin and this relieved his pain to some extent but now the pain is now a 5/10. It is predominantly in the upper left chest.   Hospital Course:    Atypical Chest pain- resolved.TIMI score = 2. Cardiac enzymes have been negative. D dimer negative. Ekg does not  show ischemic changes. Currently chest pain free. Cardiology consultation called for further recommendations. Resume aspirin 81 mg daily,  And atenolol on discharge for multiple PVC'S. Echocardiogram showed normal EF. Stress test today showed apical and lateral ischemia. He underwent  cardiac cath showed minimal CAD.   Dyspnea-resolved No orthopnea or PND. Does not appear to be fluid overloaded. Pro bnp is 36. D dimer is negative. CT angio of the chest negative for PE. Hypertension-bp better controlled. Resume home medicatons. Low back pain-continue home medications of Percocet 5/325.   anxiety-currently stable.    Procedures: Cardiac catheterization: Only mild Coronary disease by angiographic evaluation. No obstructive lesions noted.  Normal left ventricular function with mildly elevated LVEDP.   Nuclear stress test.   Consultations:  cardiology  Discharge Exam: Filed Vitals:   04/20/12 1031 04/20/12 1247 04/20/12 1300 04/20/12 1319  BP:  151/77 149/81 157/90  Pulse: 69 49 62 66  Temp:  97.5 F (36.4 C)    TempSrc:  Oral    Resp:  16    Height:      Weight:      SpO2:  97% 97% 96%    Cardiovascular: S1-S2 no murmur rub or gallop PMI inside left fifth intercostal  Respiratory: clinically clear  Abdomen: soft nontender nondistended no rebound  Skin: no rash  Musculoskeletal: moves all 4 limbs equally  Discharge Instructions  Discharge Orders   Future Appointments Provider  Department Dept Phone   08/31/2012 8:00 AM Lindley Magnus, MD  [facility] [phone]   Future Orders Complete By Expires     Diet - low sodium heart healthy  As directed     Discharge instructions  As directed     Comments:      Follow up with cardiology with [facility] and get an event monitor .        Medication List    TAKE these medications       amLODipine 5 MG tablet  Commonly known as:  NORVASC  Take 5 mg by mouth daily.     aspirin 81 MG EC tablet  Take 1 tablet (81  mg total) by mouth daily.     atenolol 100 MG tablet  Commonly known as:  TENORMIN  Take 100 mg by mouth daily.     CALTRATE 600+D PLUS 600-400 MG-UNIT per tablet  Take 1 tablet by mouth every evening.     doxazosin 4 MG tablet  Commonly known as:  CARDURA  Take 4 mg by mouth at bedtime.     ICAPS PO  Take 1 tablet by mouth 2 (two) times daily.     lovastatin 40 MG tablet  Commonly known as:  MEVACOR  Take 40 mg by mouth every evening.     multivitamin with minerals Tabs  Take 2 tablets by mouth daily.     NON FORMULARY  Take 1 capsule by mouth daily. Herbal fiber blend     NON FORMULARY  Take 5 mLs by mouth daily. Barley Green powder- mix with liquid and drink     omeprazole 20 MG capsule  Commonly known as:  PRILOSEC  Take 20 mg by mouth daily.     oxyCODONE-acetaminophen 5-325 MG per tablet  Commonly known as:  PERCOCET/ROXICET  Take 1 tablet by mouth at bedtime. Take 1-2 prn pain     quinapril 40 MG tablet  Commonly known as:  ACCUPRIL  Take 40 mg by mouth daily.           Follow-up Information   Follow up with HARDING,DAVID W, MD. (follow up with cardiology for post cath visit our office will call with date and time)    Contact information:   [address] [place] [address] [phone]        The results of significant diagnostics from this hospitalization (including imaging, microbiology, ancillary and laboratory) are listed below for reference.    Significant Diagnostic Studies: Ct Angio Chest Pe W/cm &/or Wo Cm  04/16/2012  *RADIOLOGY REPORT*  Clinical Data: Shortness of breath.  Weakness.  CT ANGIOGRAPHY CHEST  Technique:  Multidetector CT imaging of the chest using the standard protocol during bolus administration of intravenous contrast. Multiplanar reconstructed images including MIPs were obtained and reviewed to evaluate the vascular anatomy.  Contrast: 60mL OMNIPAQUE IOHEXOL 350 MG/ML SOLN   Comparison: Chest x-ray dated 04/16/2012 and chest CT dated 01/26/2007  Findings: There are no pulmonary emboli.  The lungs are clear.  No effusions.  No significant osseous abnormality.  The visualized portion of the upper abdomen is normal.  IMPRESSION: Normal exam.  Specifically, no pulmonary emboli.   Original Report Authenticated By: Francene Boyers, M.D.    Nm Myocar Multi W/spect W/wall Motion / Ef  04/18/2012  *RADIOLOGY REPORT*  Clinical Data:  Chest pain and history of hyperlipidemia and hypertension.  MYOCARDIAL IMAGING WITH SPECT (REST AND PHARMACOLOGIC-STRESS) GATED LEFT  VENTRICULAR WALL MOTION STUDY LEFT VENTRICULAR EJECTION FRACTION  Technique:  Standard myocardial SPECT imaging was performed after resting intravenous injection of 10 mCi Tc-89m sestamibi. Subsequently, intravenous infusion of Lexiscan was performed under the supervision of the Cardiology staff.  At peak effect of the drug, 30 mCi Tc-70m sestamibi was injected intravenously and standard myocardial SPECT  imaging was performed.  Quantitative gated imaging was also performed to evaluate left ventricular wall motion, and estimate left ventricular ejection fraction.  Comparison:  None.  Findings: Utilizing gated data, the end-diastolic volume is estimated to be 92 ml and the end-systolic volume 35 ml. Calculated ejection fraction is 62%.  Gated wall motion analysis is within normal limits.  SPECT imaging shows a focal and relatively small reversible perfusion defect involving the left ventricular apex and extending into the distal lateral wall.  This may be consistent with ischemia in this distribution.  No evidence of fixed perfusion defects.  IMPRESSION:  1.  Normal left ventricular function with calculated ejection fraction of 62%. 2.  Suggestion of small territory of reversible and inducible ischemia involving the left ventricular apex and distal lateral wall.   Original Report Authenticated By: Irish Lack, M.D.    Dg Chest Port  1 View  04/16/2012  *RADIOLOGY REPORT*  Clinical Data: Chest pain and shortness of breath.  PORTABLE CHEST - 1 VIEW  Comparison: 05/13/2008.  Findings: Trachea is midline.  Heart is enlarged and accentuated by AP semi upright technique.  Lungs are somewhat low in volume with minimal bibasilar subsegmental atelectasis.  Biapical pleural thickening.  No pleural fluid.  IMPRESSION: Low lung volumes with minimal bibasilar subsegmental atelectasis.   Original Report Authenticated By: Leanna Battles, M.D.     Microbiology: No results found for this or any previous visit (from the past 240 hour(s)).   Labs: Basic Metabolic Panel:  Recent Labs Lab 04/16/12 1359 04/16/12 2007 04/17/12 0833 04/20/12 0635  NA 143  --  142 141  K 4.6  --  4.5 4.2  CL 109  --  106 107  CO2 26  --  27 27  GLUCOSE 106*  --  108* 106*  BUN 19  --  16 17  CREATININE 0.85 0.81 0.88 0.95  CALCIUM 9.0  --  9.2 9.0  MG  --   --  2.0  --    Liver Function Tests:  Recent Labs Lab 04/16/12 1539 04/17/12 0833  AST 18 14  ALT 14 13  ALKPHOS 63 64  BILITOT 0.4 0.9  PROT 6.6 6.3  ALBUMIN 3.6 3.5   No results found for this basename: LIPASE, AMYLASE,  in the last 168 hours No results found for this basename: AMMONIA,  in the last 168 hours CBC:  Recent Labs Lab 04/16/12 1359 04/16/12 2007 04/17/12 0833 04/20/12 0635  WBC 7.6 6.9 6.9 6.0  HGB 14.4 14.1 13.7 13.8  HCT 41.9 40.0 41.0 40.1  MCV 87.7 87.0 87.4 86.2  PLT 163 154 149* 138*   Cardiac Enzymes:  Recent Labs Lab 04/16/12 2008 04/17/12 0146 04/17/12 0832  TROPONINI <0.30 <0.30 <0.30   BNP: BNP (last 3 results)  Recent Labs  04/16/12 1539  PROBNP 36.2   CBG: No results found for this basename: GLUCAP,  in the last 168 hours     Signed:  Cortni Tays  [facility] 04/20/2012, 2:44 PM

## 2012-04-20 NOTE — H&P (View-Only) (Signed)
[facility] Patient ID: Xavier R Strike Jr., male   DOB: 05/31/1940, 71 y.o.   MRN: [id number]   Subjective: No further CP.  No SOB or dizziness.   Resting comfortably. Watched Cath video with daughter last PM - no questions.  Objective: Vital signs in last 24 hours: Temp:  [97.7 F (36.5 C)-98.6 F (37 C)] 98.6 F (37 C) (02/23 0500) Pulse Rate:  [63-95] 95 (02/23 0506) Resp:  [16-20] 16 (02/23 0500) BP: (117-159)/(68-80) 121/77 mmHg (02/23 0506) SpO2:  [92 %-97 %] 92 % (02/23 0500) Last BM Date: 04/17/12  Intake/Output from previous day:   Intake/Output this shift: Total I/O In: 483 [P.O.:480; I.V.:3] Out: -   Medications Current Facility-Administered Medications  Medication Dose Route Frequency Provider Last Rate Last Dose  . 0.9 %  sodium chloride infusion  250 mL Intravenous PRN Niyla Marone W Maryalyce Sanjuan, MD      . [START ON 04/21/2012] 0.9 %  sodium chloride infusion  1 mL/kg/hr Intravenous Continuous Tadarius Maland W Nicolena Schurman, MD      . [START ON 04/21/2012] aspirin chewable tablet 324 mg  324 mg Oral Pre-Cath [place] Alfredo W Noralee Dutko, MD      . aspirin EC tablet 81 mg  81 mg Oral Daily Jai-Gurmukh Samtani, MD   81 mg at 04/19/12 0934  . [START ON 04/20/2012] diazepam (VALIUM) tablet 5 mg  5 mg Oral On Call Dionne Knoop W Rawn Quiroa, MD      . doxazosin (CARDURA) tablet 4 mg  4 mg Oral QHS Jai-Gurmukh Samtani, MD   4 mg at 04/18/12 2124  . heparin injection 5,000 Units  5,000 Units Subcutaneous Q8H Jai-Gurmukh Samtani, MD   5,000 Units at 04/19/12 0636  . metoprolol tartrate (LOPRESSOR) tablet 12.5 mg  12.5 mg Oral BID Bryan Hager, PA   12.5 mg at 04/19/12 0934  . morphine 2 MG/ML injection 1 mg  1 mg Intravenous Q3H PRN Jai-Gurmukh Samtani, MD      . nitroGLYCERIN (NITRODUR - Dosed in mg/24 hr) patch 0.3 mg  0.3 mg Transdermal Daily Jai-Gurmukh Samtani, MD   0.3 mg at 04/19/12 0937  . oxyCODONE-acetaminophen (PERCOCET/ROXICET) 5-325 MG per tablet 1-2 tablet  1-2 tablet Oral Q8H  PRN Vijaya Akula, MD   1 tablet at 04/18/12 2005  . pantoprazole (PROTONIX) EC tablet 40 mg  40 mg Oral Daily Jai-Gurmukh Samtani, MD   40 mg at 04/19/12 0934  . simvastatin (ZOCOR) tablet 20 mg  20 mg Oral q1800 Jai-Gurmukh Samtani, MD   20 mg at 04/18/12 1717  . sodium chloride 0.9 % injection 3 mL  3 mL Intravenous Q12H Jai-Gurmukh Samtani, MD   3 mL at 04/19/12 0937  . sodium chloride 0.9 % injection 3 mL  3 mL Intravenous Q12H Darielys Giglia W Mareesa Gathright, MD      . sodium chloride 0.9 % injection 3 mL  3 mL Intravenous PRN Audreena Sachdeva W Erinne Gillentine, MD        PE: General appearance: alert, cooperative and no distress Lungs: clear to auscultation bilaterally, non-labored. Heart: regular rate and rhythm, S1, S2 normal, no murmur, click, rub or gallop Extremities: Trace LEE Pulses: 2+ and symmetric Neurologic: Grossly normal  Lab Results:   Recent Labs  04/16/12 1359 04/16/12 2007 04/17/12 0833  WBC 7.6 6.9 6.9  HGB 14.4 14.1 13.7  HCT 41.9 40.0 41.0  PLT 163 154 149*   BMET  Recent Labs  04/16/12 1359 04/16/12 2007 04/17/12 0833  NA 143  --  142    K 4.6  --  4.5  CL 109  --  106  CO2 26  --  27  GLUCOSE 106*  --  108*  BUN 19  --  16  CREATININE 0.85 0.81 0.88  CALCIUM 9.0  --  9.2   PT/INR  Recent Labs  04/17/12 0833  LABPROT 13.0  INR 0.99   Cardiac Panel (last 3 results)  Recent Labs  04/16/12 2008 04/17/12 0146 04/17/12 0832  TROPONINI <0.30 <0.30 <0.30   Lipid Panel     Component Value Date/Time   CHOL 162 01/03/2012 0811   TRIG 82.0 01/03/2012 0811   HDL 54.70 01/03/2012 0811   CHOLHDL 3 01/03/2012 0811   VLDL 16.4 01/03/2012 0811   LDLCALC 91 01/03/2012 0811   ECHO - EF 6-65%, mod Conc LVH, Grade 1 Diastolic dysfunction (abnormal relaxation); Severe LA dilation.  Mild RA dilation. MYOVIEW: IMPRESSION:  1. Normal left ventricular function with calculated ejection fraction of 62%.  2. Suggestion of small territory of reversible and inducible ischemia involving the  left ventricular apex and distal lateral wall.   Assessment/Plan   Principal Problem:   Unstable angina pectoris Active Problems:   HYPERTENSION   Dyspnea on exertion   Abnormal nuclear stress test   HYPERLIPIDEMIA   GERD   COLONIC POLYPS, HX OF   reported prior h/o CAD  Plan:  Ruled out for MI.  Lipid panel looks good.  He tolerated the Lexiscan fairly well - but anpical & lateral ischemia noted.   HR & BP improved with addition of BB.  Continue NTG paste.  Plan LHC/Angio in AM for SSx of Chest pain - consistent with Unstable Angina & Abnormal Myoview.  Statin  PPI & SQ Heparin (unless recurrence of CP).    LOS: 3 days    Wilda Wetherell W 04/19/2012 11:20 AM    

## 2012-04-20 NOTE — Interval H&P Note (Signed)
History and Physical Interval Note:  04/20/2012 11:02 AM  Xavier Cook.  has presented today for surgery, with the diagnosis of cp  The various methods of treatment have been discussed with the patient and family. After consideration of risks, benefits and other options for treatment, the patient has consented to  Procedure(s): LEFT HEART CATHETERIZATION WITH CORONARY ANGIOGRAM (N/A) & possible PCI as a surgical intervention .  The patient's history has been reviewed, patient examined, no change in status, stable for surgery.  I have reviewed the patient's chart and labs.  Questions were answered to the patient's satisfaction.     Nandan Willems W

## 2012-04-22 ENCOUNTER — Telehealth: Payer: Self-pay | Admitting: Internal Medicine

## 2012-04-22 NOTE — Telephone Encounter (Signed)
Please call the patient and make appt.

## 2012-04-22 NOTE — Telephone Encounter (Signed)
Caller: Xavier Cook/Patient; Phone: [phone]; Reason for Call: Xavier Cook from office to hospital by 911 with chest pain, difficulty breathing on Wed 2/19.  Discharged Mon 2/24 after lot of scans, Xrays, tests.  Told to follow up in this office within 2 weeks - needs to make Appt.    Also asking if he should be following up with Dr Onalee Hua Hardy/cardiologist that saw him in the hospital or if he will be needing to be referred to [facility] Cardiologist.  Can reach patient at [phone], please

## 2012-04-22 NOTE — Telephone Encounter (Signed)
Pt is sch with NP on 3-10

## 2012-05-04 ENCOUNTER — Encounter: Payer: Self-pay | Admitting: Family

## 2012-05-04 ENCOUNTER — Ambulatory Visit (INDEPENDENT_AMBULATORY_CARE_PROVIDER_SITE_OTHER): Payer: Medicare Other | Admitting: Family

## 2012-05-04 VITALS — BP 162/90 | HR 67 | Wt 246.0 lb

## 2012-05-04 DIAGNOSIS — I10 Essential (primary) hypertension: Secondary | ICD-10-CM

## 2012-05-04 DIAGNOSIS — Z9889 Other specified postprocedural states: Secondary | ICD-10-CM

## 2012-05-04 DIAGNOSIS — E7800 Pure hypercholesterolemia, unspecified: Secondary | ICD-10-CM

## 2012-05-04 MED ORDER — NYSTATIN-TRIAMCINOLONE 100000-0.1 UNIT/GM-% EX OINT: 1.0000 "application " | TOPICAL_OINTMENT | CUTANEOUS | Status: DC | PRN

## 2012-05-04 MED ORDER — CLOTRIMAZOLE-BETAMETHASONE 1-0.05 % EX CREA: TOPICAL_CREAM | Freq: Two times a day (BID) | CUTANEOUS | Status: AC

## 2012-05-04 NOTE — Progress Notes (Signed)
Subjective:    Patient ID: Xavier Cook., male    DOB: 1940-03-08, 72 y.o.   MRN: [id number]  HPI 72 year old white male, nonsmoker, patient of Dr. Cato Mulligan is in today as a hospital followup. He was sent to the emergency department after being seen here with complaints of chest pain. He later underwent a cardiac catheterization, stress test, echocardiogram , and had a normal CT scan of the chest. Today, he feels much better. Reports occasional shortness of breath was significantly improved. Denies any chest pain.   Review of Systems  Constitutional: Negative.   HENT: Negative.   Respiratory: Negative.   Cardiovascular: Negative.   Gastrointestinal: Negative.   Genitourinary: Negative.   Musculoskeletal: Negative.   Neurological: Negative.   Hematological: Negative.   Psychiatric/Behavioral: Negative.    Past Medical History  Diagnosis Date  . Visual floaters     asteroid hyalosis  . Hyperlipidemia   . Hypertension   . Back pain   . Personal history of colonic polyps     History   Social History  . Marital Status: Married    Spouse Name: N/A    Number of Children: N/A  . Years of Education: N/A   Occupational History  . Not on file.   Social History Main Topics  . Smoking status: Former Smoker    Types: Cigarettes    Quit date: 10/26/1995  . Smokeless tobacco: Never Used  . Alcohol Use: No  . Drug Use: No  . Sexually Active: Not on file   Other Topics Concern  . Not on file   Social History Narrative  . No narrative on file    Past Surgical History  Procedure Laterality Date  . Cataract extraction, bilateral  2012  . Colonoscopy    . Sigmoidoscopy    . Knee surgery      left arthroscopy  . Upper gastrointestinal endoscopy    . 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 Nerve blocks    . Tennis elbow surg    . Cholecystectomy    . Knee cartilage surgery      right knee  . Tonsillectomy and adenoidectomy      Family History  Problem Relation  Age of Onset  . Stroke Mother   . Hypertension Mother   . Breast cancer Sister   . Colon cancer Neg Hx     Allergies  Allergen Reactions  . Aspirin Itching and Rash    Current Outpatient Prescriptions on File Prior to Visit  Medication Sig Dispense Refill  . amLODipine (NORVASC) 5 MG tablet Take 5 mg by mouth daily.      Marland Kitchen aspirin EC 81 MG EC tablet Take 1 tablet (81 mg total) by mouth daily.      Marland Kitchen atenolol (TENORMIN) 100 MG tablet Take 100 mg by mouth daily.       . Calcium Carbonate-Vit D-Min (CALTRATE 600+D PLUS) 600-400 MG-UNIT per tablet Take 1 tablet by mouth every evening.       Marland Kitchen doxazosin (CARDURA) 4 MG tablet Take 4 mg by mouth at bedtime.      . lovastatin (MEVACOR) 40 MG tablet Take 40 mg by mouth every evening.      . Multiple Vitamin (MULTIVITAMIN WITH MINERALS) TABS Take 2 tablets by mouth daily.      . Multiple Vitamins-Minerals (ICAPS PO) Take 1 tablet by mouth 2 (two) times daily.      . NON FORMULARY Take 1  capsule by mouth daily. Herbal fiber blend      . NON FORMULARY Take 5 mLs by mouth daily. Barley Green powder- mix with liquid and drink      . omeprazole (PRILOSEC) 20 MG capsule Take 20 mg by mouth daily.      Marland Kitchen oxyCODONE-acetaminophen (PERCOCET) 5-325 MG per tablet Take 1 tablet by mouth at bedtime. Take 1-2 prn pain      . quinapril (ACCUPRIL) 40 MG tablet Take 40 mg by mouth daily.       No current facility-administered medications on file prior to visit.    BP 162/90  Pulse 67  Wt 246 lb (111.585 kg)  BMI 34.33 kg/m2  SpO2 98%chart    Objective:   Physical Exam  Constitutional: He is oriented to person, place, and time. He appears well-developed and well-nourished.  HENT:  Right Ear: External ear normal.  Left Ear: External ear normal.  Mouth/Throat: Oropharynx is clear and moist.  Neck: Normal range of motion. Neck supple.  Cardiovascular: Normal rate, regular rhythm and normal heart sounds.   130/74-recheck blood pressure.    Pulmonary/Chest: Effort normal and breath sounds normal.  Abdominal: Soft. Bowel sounds are normal.  Musculoskeletal: Normal range of motion.  Neurological: He is alert and oriented to person, place, and time.  Skin: Skin is warm and dry.  Psychiatric: He has a normal mood and affect.          Assessment & Plan:  Assessment:  1. Chest pain-improved 2. Status post hospital followup for cardiac catheterization 3. Hypertension 4. Hyperlipidemia  Plan: Followup with cardiology as scheduled this afternoon. Continue exercising. Low-cholesterol diet. Continue current medications. Followup with Dr. Cato Mulligan as scheduled, and as needed.

## 2012-05-04 NOTE — Addendum Note (Signed)
Addended byAdline Mango B on: 05/04/2012 09:28 AM   Modules accepted: Orders

## 2012-05-18 ENCOUNTER — Other Ambulatory Visit: Payer: Self-pay | Admitting: *Deleted

## 2012-05-18 MED ORDER — OMEPRAZOLE 20 MG PO CPDR: 20.0000 mg | DELAYED_RELEASE_CAPSULE | Freq: Every day | ORAL | Status: DC

## 2012-05-26 ENCOUNTER — Encounter: Payer: Self-pay | Admitting: Internal Medicine

## 2012-05-27 ENCOUNTER — Other Ambulatory Visit: Payer: Self-pay | Admitting: Internal Medicine

## 2012-06-11 ENCOUNTER — Other Ambulatory Visit: Payer: Self-pay | Admitting: Internal Medicine

## 2012-07-07 ENCOUNTER — Other Ambulatory Visit: Payer: Self-pay | Admitting: Internal Medicine

## 2012-08-31 ENCOUNTER — Encounter: Payer: Self-pay | Admitting: Internal Medicine

## 2012-08-31 ENCOUNTER — Ambulatory Visit (INDEPENDENT_AMBULATORY_CARE_PROVIDER_SITE_OTHER): Payer: Medicare Other | Admitting: Internal Medicine

## 2012-08-31 VITALS — BP 142/80 | HR 80 | Temp 98.3°F | Ht 71.0 in | Wt 247.0 lb

## 2012-08-31 DIAGNOSIS — D696 Thrombocytopenia, unspecified: Secondary | ICD-10-CM

## 2012-08-31 DIAGNOSIS — E785 Hyperlipidemia, unspecified: Secondary | ICD-10-CM

## 2012-08-31 DIAGNOSIS — E8881 Metabolic syndrome: Secondary | ICD-10-CM

## 2012-08-31 LAB — BASIC METABOLIC PANEL
CO2: 28 mEq/L (ref 19–32)
Calcium: 8.8 mg/dL (ref 8.4–10.5)
Chloride: 111 mEq/L (ref 96–112)
Creatinine, Ser: 0.8 mg/dL (ref 0.4–1.5)
Glucose, Bld: 123 mg/dL — ABNORMAL HIGH (ref 70–99)

## 2012-08-31 LAB — LIPID PANEL
Cholesterol: 168 mg/dL (ref 0–200)
LDL Cholesterol: 94 mg/dL (ref 0–99)
Total CHOL/HDL Ratio: 3
Triglycerides: 99 mg/dL (ref 0.0–149.0)

## 2012-08-31 LAB — CBC WITH DIFFERENTIAL/PLATELET
Basophils Relative: 0.6 % (ref 0.0–3.0)
HCT: 41.7 % (ref 39.0–52.0)
Hemoglobin: 14.1 g/dL (ref 13.0–17.0)
Lymphocytes Relative: 21.7 % (ref 12.0–46.0)
Lymphs Abs: 1.3 10*3/uL (ref 0.7–4.0)
Monocytes Relative: 6.2 % (ref 3.0–12.0)
Neutro Abs: 4.2 10*3/uL (ref 1.4–7.7)
RBC: 4.65 Mil/uL (ref 4.22–5.81)
RDW: 13.3 % (ref 11.5–14.6)

## 2012-08-31 LAB — HEPATIC FUNCTION PANEL
ALT: 18 U/L (ref 0–53)
AST: 20 U/L (ref 0–37)
Total Bilirubin: 0.6 mg/dL (ref 0.3–1.2)
Total Protein: 6.6 g/dL (ref 6.0–8.3)

## 2012-08-31 NOTE — Assessment & Plan Note (Signed)
Needs to lose weight- will help with htn, hyperglycemia and overall risk for CAD

## 2012-08-31 NOTE — Progress Notes (Signed)
Patient ID: Xavier Phlegm., male   DOB: 1940-10-05, 72 y.o.   MRN: [id number]  Reviewed hospital notes.  htn- tolerating meds- no home bps  gerd- no sxs on meds  Chronic back pain-- uses oxycodone  Reviewed pmh, psh, sochx  Reviewed meds   patient denies chest pain, shortness of breath, orthopnea. Denies lower extremity edema, abdominal pain, change in appetite, change in bowel movements. Patient denies rashes, musculoskeletal complaints. No other specific complaints in a complete review of systems.    well-developed well-nourished male in no acute distress. HEENT exam atraumatic, normocephalic, neck supple without jugular venous distention. Chest clear to auscultation cardiac exam S1-S2 are regular. Abdominal exam overweight with bowel sounds, soft and nontender. Extremities 1+  Edema at ankles. Neurologic exam is alert with a normal gait.

## 2012-08-31 NOTE — Assessment & Plan Note (Signed)
On lovastatin- check labs today

## 2012-09-11 ENCOUNTER — Other Ambulatory Visit: Payer: Self-pay | Admitting: Internal Medicine

## 2012-10-20 ENCOUNTER — Other Ambulatory Visit: Payer: Self-pay | Admitting: Internal Medicine

## 2012-11-27 ENCOUNTER — Other Ambulatory Visit: Payer: Self-pay | Admitting: Internal Medicine

## 2012-12-22 ENCOUNTER — Other Ambulatory Visit: Payer: Self-pay | Admitting: *Deleted

## 2012-12-22 MED ORDER — ATENOLOL 100 MG PO TABS: ORAL_TABLET | ORAL | Status: DC

## 2012-12-22 MED ORDER — LOVASTATIN 40 MG PO TABS: ORAL_TABLET | ORAL | Status: DC

## 2012-12-31 ENCOUNTER — Other Ambulatory Visit: Payer: Self-pay

## 2013-01-08 ENCOUNTER — Other Ambulatory Visit: Payer: Self-pay | Admitting: Internal Medicine

## 2013-01-18 ENCOUNTER — Other Ambulatory Visit: Payer: Self-pay | Admitting: *Deleted

## 2013-01-18 MED ORDER — DOXAZOSIN MESYLATE 4 MG PO TABS: ORAL_TABLET | ORAL | Status: DC

## 2013-01-18 MED ORDER — AMLODIPINE BESYLATE 5 MG PO TABS: ORAL_TABLET | ORAL | Status: DC

## 2013-02-26 ENCOUNTER — Encounter: Payer: Self-pay | Admitting: Cardiology

## 2013-02-26 ENCOUNTER — Ambulatory Visit (INDEPENDENT_AMBULATORY_CARE_PROVIDER_SITE_OTHER): Payer: Medicare Other | Admitting: Cardiology

## 2013-02-26 ENCOUNTER — Ambulatory Visit: Payer: Medicare Other | Admitting: Physician Assistant

## 2013-02-26 VITALS — BP 148/80 | HR 58 | Ht 71.0 in | Wt 262.9 lb

## 2013-02-26 DIAGNOSIS — E669 Obesity, unspecified: Secondary | ICD-10-CM

## 2013-02-26 DIAGNOSIS — E785 Hyperlipidemia, unspecified: Secondary | ICD-10-CM

## 2013-02-26 DIAGNOSIS — Z0389 Encounter for observation for other suspected diseases and conditions ruled out: Secondary | ICD-10-CM

## 2013-02-26 DIAGNOSIS — I10 Essential (primary) hypertension: Secondary | ICD-10-CM

## 2013-02-26 DIAGNOSIS — I4510 Unspecified right bundle-branch block: Secondary | ICD-10-CM | POA: Insufficient documentation

## 2013-02-26 DIAGNOSIS — IMO0001 Reserved for inherently not codable concepts without codable children: Secondary | ICD-10-CM

## 2013-02-26 NOTE — Patient Instructions (Addendum)
Your physician has recommended that you have a sleep study. This test records several body functions during sleep, including: brain activity, eye movement, oxygen and carbon dioxide blood levels, heart rate and rhythm, breathing rate and rhythm, the flow of air through your mouth and nose, snoring, body muscle movements, and chest and belly movement. Your physician recommends that you schedule a follow-up appointment in: 6 months with Dr Debara Pickett unless sleep study is positive- then see Dr Claiborne Billings

## 2013-02-26 NOTE — Progress Notes (Signed)
02/26/2013 Xavier Flock Jr.   1940/05/28  [id number]  Primary Physicia Chancy Hurter, MD Primary Cardiologist: Dr Debara Pickett  HPI:  73 y/o followed by Dr Debara Pickett. He was admitted in Feb 2014 with near syncope and chest pain. Echo showed LAE (23mm) and normal LVF. Myoview was abnormal but subsequent cath showed only minor disease with 20-30% LAD. On telemetry he had PVCs with post PVC pauses but no other arrythmia. He is here to day for a follow up. He is obese and on questioning he has poor sleep patterns with frequent awakening. His is a widower X 2 years but says his wife told him snores a lot. He denies any syncope, palpitations, or further chest pain.    Current Outpatient Prescriptions  Medication Sig Dispense Refill  . amLODipine (NORVASC) 5 MG tablet TAKE 1 TABLET (5 MG TOTAL) BY MOUTH DAILY.  90 tablet  1  . aspirin EC 81 MG EC tablet Take 1 tablet (81 mg total) by mouth daily.      Marland Kitchen atenolol (TENORMIN) 100 MG tablet TAKE ONE-HALF TABLET BY MOUTH TWICE A DAY  180 tablet  1  . Calcium Carbonate-Vit D-Min (CALTRATE 600+D PLUS) 600-400 MG-UNIT per tablet Take 1 tablet by mouth every evening.       . clotrimazole-betamethasone (LOTRISONE) cream Apply topically 2 (two) times daily.  30 g  0  . doxazosin (CARDURA) 4 MG tablet TAKE ONE TABLET (4 MG TOTAL) BY MOUTH AT BEDTIME.  90 tablet  1  . lovastatin (MEVACOR) 40 MG tablet TAKE ONE TABLET BY MOUTH ONCE A DAY  90 tablet  1  . Multiple Vitamin (MULTIVITAMIN WITH MINERALS) TABS Take 2 tablets by mouth daily.      . Multiple Vitamins-Minerals (ICAPS PO) Take 1 tablet by mouth 2 (two) times daily.      Marland Kitchen omeprazole (PRILOSEC) 20 MG capsule TAKE ONE CAPSULE  BY MOUTH ONCE A DAY  90 capsule  0  . oxyCODONE-acetaminophen (PERCOCET) 5-325 MG per tablet Take 1 tablet by mouth at bedtime. Take 1-2 prn pain      . quinapril (ACCUPRIL) 40 MG tablet TAKE ONE TABLET BY MOUTH ONCE A DAY  90 tablet  2   No current facility-administered medications for  this visit.    Allergies  Allergen Reactions  . Aspirin Itching and Rash    History   Social History  . Marital Status: Married    Spouse Name: N/A    Number of Children: N/A  . Years of Education: N/A   Occupational History  . Not on file.   Social History Main Topics  . Smoking status: Former Smoker    Types: Cigarettes    Quit date: 10/26/1995  . Smokeless tobacco: Never Used  . Alcohol Use: No  . Drug Use: No  . Sexual Activity: Not on file   Other Topics Concern  . Not on file   Social History Narrative  . No narrative on file     Review of Systems: General: negative for chills, fever, night sweats or weight changes.  Cardiovascular: negative for chest pain, dyspnea on exertion, edema, orthopnea, palpitations, paroxysmal nocturnal dyspnea or shortness of breath Dermatological: negative for rash Respiratory: negative for cough or wheezing Urologic: negative for hematuria Abdominal: negative for nausea, vomiting, diarrhea, bright red blood per rectum, melena, or hematemesis Neurologic: negative for visual changes, syncope, or dizziness All other systems reviewed and are otherwise negative except as noted above.    Blood pressure 148/80, pulse  58, height 5\' 11"  (1.803 m), weight 262 lb 14.4 oz (119.251 kg).  General appearance: alert, cooperative, no distress and morbidly obese Neck: no carotid bruit, no JVD and thick neck Lungs: clear to auscultation bilaterally Heart: regular rate and rhythm Abdomen: obese  EKG NSR, SB, RBBB  ASSESSMENT AND PLAN:   Normal coronary arteries Feb 2014 Admitted then with near syncope, chest pain- abnormal Myoview but normal cors.  HYPERTENSION .  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   PLAN  I ordered a sleep study for Mr Xavier Cook. He will follow up with Dr Debara Pickett in 6 months,  or Dr Claiborne Billings if his sleep study is positive.  Diamon Reddinger KPA-C 02/26/2013 10:02 AM

## 2013-02-26 NOTE — Assessment & Plan Note (Signed)
He has suspected sleep apnea, snores, HTN, obese, poor sleep with frequent waking

## 2013-02-26 NOTE — Assessment & Plan Note (Signed)
Admitted then with near syncope, chest pain- abnormal Myoview but normal cors.

## 2013-02-26 NOTE — Assessment & Plan Note (Signed)
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

## 2013-03-03 ENCOUNTER — Ambulatory Visit: Payer: Medicare Other | Admitting: Internal Medicine

## 2013-03-05 ENCOUNTER — Ambulatory Visit (INDEPENDENT_AMBULATORY_CARE_PROVIDER_SITE_OTHER): Payer: Medicare Other | Admitting: Internal Medicine

## 2013-03-05 ENCOUNTER — Encounter: Payer: Self-pay | Admitting: Internal Medicine

## 2013-03-05 VITALS — BP 112/66 | HR 80 | Temp 98.2°F | Ht 71.0 in | Wt 260.0 lb

## 2013-03-05 DIAGNOSIS — I10 Essential (primary) hypertension: Secondary | ICD-10-CM

## 2013-03-05 DIAGNOSIS — R7309 Other abnormal glucose: Secondary | ICD-10-CM

## 2013-03-05 DIAGNOSIS — K219 Gastro-esophageal reflux disease without esophagitis: Secondary | ICD-10-CM

## 2013-03-05 DIAGNOSIS — E785 Hyperlipidemia, unspecified: Secondary | ICD-10-CM

## 2013-03-05 DIAGNOSIS — R739 Hyperglycemia, unspecified: Secondary | ICD-10-CM

## 2013-03-05 LAB — HEMOGLOBIN A1C: HEMOGLOBIN A1C: 5.6 % (ref 4.6–6.5)

## 2013-03-05 NOTE — Progress Notes (Signed)
htn- tolerating meds  Lipids- on lovastatin- taking meds  GERD- no sxs on meds  Hyperglycemia- note weight gain  Past Medical History  Diagnosis Date  . Visual floaters     asteroid hyalosis  . Hyperlipidemia   . Hypertension   . Back pain   . Personal history of colonic polyps     History   Social History  . Marital Status: Married    Spouse Name: N/A    Number of Children: N/A  . Years of Education: N/A   Occupational History  . Not on file.   Social History Main Topics  . Smoking status: Former Smoker    Types: Cigarettes    Quit date: 10/26/1995  . Smokeless tobacco: Never Used  . Alcohol Use: No  . Drug Use: No  . Sexual Activity: Not on file   Other Topics Concern  . Not on file   Social History Narrative  . No narrative on file    Past Surgical History  Procedure Laterality Date  . Cataract extraction, bilateral  2012  . Colonoscopy    . Sigmoidoscopy    . Knee surgery      left arthroscopy  . Upper gastrointestinal endoscopy    . 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 Nerve blocks    . Tennis elbow surg    . Cholecystectomy    . Knee cartilage surgery      right knee  . Tonsillectomy and adenoidectomy      Family History  Problem Relation Age of Onset  . Stroke Mother   . Hypertension Mother   . Breast cancer Sister   . Colon cancer Neg Hx     Allergies  Allergen Reactions  . Aspirin Itching and Rash    Current Outpatient Prescriptions on File Prior to Visit  Medication Sig Dispense Refill  . amLODipine (NORVASC) 5 MG tablet TAKE 1 TABLET (5 MG TOTAL) BY MOUTH DAILY.  90 tablet  1  . atenolol (TENORMIN) 100 MG tablet TAKE ONE-HALF TABLET BY MOUTH TWICE A DAY  180 tablet  1  . Calcium Carbonate-Vit D-Min (CALTRATE 600+D PLUS) 600-400 MG-UNIT per tablet Take 1 tablet by mouth every evening.       . clotrimazole-betamethasone (LOTRISONE) cream Apply topically 2 (two) times daily.  30 g  0  . doxazosin (CARDURA) 4 MG tablet TAKE ONE  TABLET (4 MG TOTAL) BY MOUTH AT BEDTIME.  90 tablet  1  . lovastatin (MEVACOR) 40 MG tablet TAKE ONE TABLET BY MOUTH ONCE A DAY  90 tablet  1  . Multiple Vitamin (MULTIVITAMIN WITH MINERALS) TABS Take 2 tablets by mouth daily.      . Multiple Vitamins-Minerals (ICAPS PO) Take 1 tablet by mouth 2 (two) times daily.      Marland Kitchen omeprazole (PRILOSEC) 20 MG capsule TAKE ONE CAPSULE  BY MOUTH ONCE A DAY  90 capsule  0  . oxyCODONE-acetaminophen (PERCOCET) 5-325 MG per tablet Take 1 tablet by mouth at bedtime. Take 1-2 prn pain      . quinapril (ACCUPRIL) 40 MG tablet TAKE ONE TABLET BY MOUTH ONCE A DAY  90 tablet  2   No current facility-administered medications on file prior to visit.     patient denies chest pain, shortness of breath, orthopnea. Denies lower extremity edema, abdominal pain, change in appetite, change in bowel movements. Patient denies rashes, musculoskeletal complaints. No other specific complaints in a complete review of systems.  BP 112/66  Pulse 80  Temp(Src) 98.2 F (36.8 C) (Oral)  Ht 5\' 11"  (1.803 m)  Wt 260 lb (117.935 kg)  BMI 36.28 kg/m2  well-developed well-nourished male in no acute distress. HEENT exam atraumatic, normocephalic, neck supple without jugular venous distention. Chest clear to auscultation cardiac exam S1-S2 are regular. Abdominal exam overweight with bowel sounds, soft and nontender. Extremities 1+ edema. Neurologic exam is alert with a normal gait.

## 2013-03-05 NOTE — Progress Notes (Signed)
Pre visit review using our clinic review tool, if applicable. No additional management support is needed unless otherwise documented below in the visit note. 

## 2013-03-05 NOTE — Assessment & Plan Note (Signed)
No sxs- continue PPI

## 2013-03-05 NOTE — Assessment & Plan Note (Signed)
Previously controlled Continue meds Labs in 6 months or so

## 2013-03-05 NOTE — Assessment & Plan Note (Signed)
Adequate control Continue meds 

## 2013-03-16 ENCOUNTER — Other Ambulatory Visit: Payer: Self-pay | Admitting: Internal Medicine

## 2013-04-25 DEATH — deceased

## 2013-06-01 ENCOUNTER — Other Ambulatory Visit: Payer: Self-pay | Admitting: Internal Medicine

## 2013-06-11 ENCOUNTER — Other Ambulatory Visit: Payer: Self-pay | Admitting: Internal Medicine

## 2013-07-14 ENCOUNTER — Other Ambulatory Visit: Payer: Self-pay | Admitting: Internal Medicine

## 2013-08-26 ENCOUNTER — Other Ambulatory Visit: Payer: Self-pay | Admitting: Internal Medicine

## 2013-09-06 ENCOUNTER — Ambulatory Visit (INDEPENDENT_AMBULATORY_CARE_PROVIDER_SITE_OTHER): Payer: Medicare Other | Admitting: Internal Medicine

## 2013-09-06 ENCOUNTER — Encounter: Payer: Self-pay | Admitting: Internal Medicine

## 2013-09-06 VITALS — BP 135/70 | HR 80 | Temp 98.3°F | Ht 71.0 in | Wt 246.0 lb

## 2013-09-06 DIAGNOSIS — R7309 Other abnormal glucose: Secondary | ICD-10-CM

## 2013-09-06 DIAGNOSIS — I10 Essential (primary) hypertension: Secondary | ICD-10-CM

## 2013-09-06 DIAGNOSIS — R739 Hyperglycemia, unspecified: Secondary | ICD-10-CM

## 2013-09-06 LAB — HEPATIC FUNCTION PANEL
ALBUMIN: 3.7 g/dL (ref 3.5–5.2)
ALK PHOS: 54 U/L (ref 39–117)
ALT: 13 U/L (ref 0–53)
AST: 13 U/L (ref 0–37)
Bilirubin, Direct: 0 mg/dL (ref 0.0–0.3)
Total Bilirubin: 0.6 mg/dL (ref 0.2–1.2)
Total Protein: 6.3 g/dL (ref 6.0–8.3)

## 2013-09-06 LAB — BASIC METABOLIC PANEL
BUN: 24 mg/dL — ABNORMAL HIGH (ref 6–23)
CHLORIDE: 110 meq/L (ref 96–112)
CO2: 27 mEq/L (ref 19–32)
Calcium: 8.9 mg/dL (ref 8.4–10.5)
Creatinine, Ser: 0.8 mg/dL (ref 0.4–1.5)
GFR: 106.22 mL/min (ref >60.00)
Glucose, Bld: 108 mg/dL — ABNORMAL HIGH (ref 70–99)
POTASSIUM: 4.3 meq/L (ref 3.5–5.1)
Sodium: 141 mEq/L (ref 135–145)

## 2013-09-06 LAB — HEMOGLOBIN A1C: Hgb A1c MFr Bld: 5.6 % (ref 4.6–6.5)

## 2013-09-06 LAB — LIPID PANEL
CHOL/HDL RATIO: 3
Cholesterol: 159 mg/dL (ref 0–200)
HDL: 52.3 mg/dL (ref >39.00)
LDL Cholesterol: 81 mg/dL (ref 0–99)
NONHDL: 106.7
Triglycerides: 127 mg/dL (ref 0.0–149.0)
VLDL: 25.4 mg/dL (ref 0.0–40.0)

## 2013-09-06 NOTE — Progress Notes (Signed)
Pre visit review using our clinic review tool, if applicable. No additional management support is needed unless otherwise documented below in the visit note. 

## 2013-09-06 NOTE — Assessment & Plan Note (Signed)
Check labs today.

## 2013-09-06 NOTE — Progress Notes (Signed)
dexcribes a tickle in his throat when he tries to sleep  htn- no home bps  Hyperglycemia- no home cbgs Lab Results  Component Value Date   HGBA1C 5.6 03/05/2013   Lipids- tolerating meds  Past Medical History  Diagnosis Date  . Visual floaters     asteroid hyalosis  . Hyperlipidemia   . Hypertension   . Back pain   . Personal history of colonic polyps     History   Social History  . Marital Status: Married    Spouse Name: N/A    Number of Children: N/A  . Years of Education: N/A   Occupational History  . Not on file.   Social History Main Topics  . Smoking status: Former Smoker    Types: Cigarettes    Quit date: 10/26/1995  . Smokeless tobacco: Never Used  . Alcohol Use: No  . Drug Use: No  . Sexual Activity: Not on file   Other Topics Concern  . Not on file   Social History Narrative  . No narrative on file    Past Surgical History  Procedure Laterality Date  . Cataract extraction, bilateral  2012  . Colonoscopy    . Sigmoidoscopy    . Knee surgery      left arthroscopy  . Upper gastrointestinal endoscopy    . 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 Nerve blocks    . Tennis elbow surg    . Cholecystectomy    . Knee cartilage surgery      right knee  . Tonsillectomy and adenoidectomy      Family History  Problem Relation Age of Onset  . Stroke Mother   . Hypertension Mother   . Breast cancer Sister   . Colon cancer Neg Hx     No Known Allergies  Current Outpatient Prescriptions on File Prior to Visit  Medication Sig Dispense Refill  . amLODipine (NORVASC) 5 MG tablet TAKE 1 TABLET (5 MG TOTAL) BY MOUTH DAILY.  90 tablet  1  . atenolol (TENORMIN) 100 MG tablet TAKE ONE-HALF TABLET BY MOUTH TWICE DAILY  90 tablet  1  . Calcium Carbonate-Vit D-Min (CALTRATE 600+D PLUS) 600-400 MG-UNIT per tablet Take 1 tablet by mouth every evening.       . clotrimazole-betamethasone (LOTRISONE) cream Apply topically 2 (two) times daily.  30 g  0  . doxazosin  (CARDURA) 4 MG tablet TAKE 1 TABLET BY MOUTH AT BEDTIME  90 tablet  1  . lovastatin (MEVACOR) 40 MG tablet TAKE 1 TABLET BY MOUTH DAILY  90 tablet  3  . Multiple Vitamin (MULTIVITAMIN WITH MINERALS) TABS Take 2 tablets by mouth daily.      . Multiple Vitamins-Minerals (ICAPS PO) Take 1 tablet by mouth 2 (two) times daily.      Marland Kitchen omeprazole (PRILOSEC) 20 MG capsule TAKE ONE CAPSULE  BY MOUTH ONCE A DAY  90 capsule  3  . oxyCODONE-acetaminophen (PERCOCET) 5-325 MG per tablet Take 1 tablet by mouth at bedtime. Take 1-2 prn pain      . quinapril (ACCUPRIL) 40 MG tablet TAKE 1 TABLET BY MOUTH DAILY  90 tablet  2   No current facility-administered medications on file prior to visit.     patient denies chest pain, shortness of breath, orthopnea. Denies lower extremity edema, abdominal pain, change in appetite, change in bowel movements. Patient denies rashes, musculoskeletal complaints. No other specific complaints in a complete review of systems.  BP 146/74  Pulse 80  Temp(Src) 98.3 F (36.8 C) (Oral)  Ht 5\' 11"  (1.803 m)  Wt 246 lb (111.585 kg)  BMI 34.33 kg/m2  well-developed well-nourished male in no acute distress. HEENT exam atraumatic, normocephalic, neck supple without jugular venous distention. Chest clear to auscultation cardiac exam S1-S2 are regular. Abdominal exam overweight with bowel sounds, soft and nontender. Extremities no edema. Neurologic exam is alert with a normal gait.

## 2013-09-06 NOTE — Assessment & Plan Note (Signed)
Will check labs in next 6 months

## 2013-09-07 ENCOUNTER — Telehealth: Payer: Self-pay | Admitting: Internal Medicine

## 2013-09-07 NOTE — Telephone Encounter (Signed)
Relevant patient education assigned to patient using Emmi. ° °

## 2013-09-10 ENCOUNTER — Telehealth: Payer: Self-pay | Admitting: Internal Medicine

## 2013-09-10 MED ORDER — ATENOLOL 100 MG PO TABS: ORAL_TABLET | ORAL | Status: AC

## 2013-09-10 NOTE — Telephone Encounter (Signed)
Xavier Cook is requesting a re-fill atenolol (TENORMIN) 100 MG tablet

## 2013-10-11 ENCOUNTER — Encounter: Payer: Self-pay | Admitting: Podiatry

## 2013-10-11 ENCOUNTER — Other Ambulatory Visit: Payer: Self-pay | Admitting: Internal Medicine

## 2013-10-11 ENCOUNTER — Ambulatory Visit (INDEPENDENT_AMBULATORY_CARE_PROVIDER_SITE_OTHER): Payer: Medicare Other | Admitting: Podiatry

## 2013-10-11 VITALS — BP 108/51 | HR 68 | Temp 97.9°F | Resp 18

## 2013-10-11 DIAGNOSIS — L03039 Cellulitis of unspecified toe: Secondary | ICD-10-CM

## 2013-10-11 MED ORDER — CEPHALEXIN 500 MG PO CAPS: 500.0000 mg | ORAL_CAPSULE | Freq: Four times a day (QID) | ORAL | Status: DC

## 2013-10-11 NOTE — Patient Instructions (Signed)
Filling antibiotic prescription if intense throbbing or redness increases ANTIBACTERIAL SOAP INSTRUCTIONS  THE DAY AFTER PROCEDURE  Please follow the instructions your doctor has marked.   Shower as usual. Before getting out, place a drop of antibacterial liquid soap (Dial) on a wet, clean washcloth.  Gently wipe washcloth over affected area.  Afterward, rinse the area with warm water.  Blot the area dry with a soft cloth and cover with antibiotic ointment (neosporin, polysporin, bacitracin) and band aid or gauze and tape  Place 3-4 drops of antibacterial liquid soap in a quart of warm tap water.  Submerge foot into water for 20 minutes.  If bandage was applied after your procedure, leave on to allow for easy lift off, then remove and continue with soak for the remaining time.  Next, blot area dry with a soft cloth and cover with a bandage.  Apply other medications as directed by your doctor, such as cortisporin otic solution (eardrops) or neosporin antibiotic ointment

## 2013-10-11 NOTE — Progress Notes (Signed)
Subjective:    Patient ID: Xavier Cook., male    DOB: August 27, 1940, 73 y.o.   MRN: [id number]  HPI MY LEFT BIG TOENAIL IS COMING OFF AND BURNS AND THROBS AND SORE AND TENDER AND IT WAS BLEEDING SINCE Saturday AND I HIT IT ON SOME FURNITURE   This patient describes multiple trauma to the left hallux toenail in the past 2 days resulting in the left hallux nail lifting off the nailbed. He said that the nail was loose prior to this.    Review of Systems  Cardiovascular: Positive for leg swelling.  Musculoskeletal:       Joint pain  Skin: Positive for rash.  Hematological: Bruises/bleeds easily.  All other systems reviewed and are negative.      Objective:   Physical Exam  Orientated x3 white male  Vascular: DP and PT pulses 2/4 bilaterally Mild pitting edema noted bilaterally   Neurological: Ankle reflex equal and reactive bilaterally  Dermatological: The left hallux nail is partially avulsed and has some minimal attachment the posterior nail fold area. There is a low-grade erythema and edema surrounding the posterior nail fold area.   Absent right hallux nail                                                                      Musculoskeletal: No deformities noted bilaterally No restriction ankle, subtalar, midtarsal joints bilaterally                                                                                    Assessment & Plan:    Assessment: Traumatic paronychia  left hallux  Plan: The left hallux is then blocked with 4 cc of 50-50 mixture 2% plain Xylocaine and 0 0.5% plain Marcaine. The hallux is pain with Betadine and exsanguinated. The left hallux was the avulsed and an antibiotic compression dressing applied. The tourniquet was released and spontaneous capillary filling time in the left hallux.  Postoperative oral reconstruction provided Patient is given a prescription to hold and fail if he has increased throbbing, erythema or pain for Cephalexin  500 mg #28 take 1  4 times a day                                                                                                                         Reappoint x3 weeks for phenol matricectomy to the left hallux

## 2013-11-03 ENCOUNTER — Encounter: Payer: Self-pay | Admitting: Podiatry

## 2013-11-03 ENCOUNTER — Ambulatory Visit (INDEPENDENT_AMBULATORY_CARE_PROVIDER_SITE_OTHER): Payer: Medicare Other | Admitting: Podiatry

## 2013-11-03 VITALS — BP 152/76 | HR 60 | Resp 12

## 2013-11-03 DIAGNOSIS — L600 Ingrowing nail: Secondary | ICD-10-CM

## 2013-11-03 NOTE — Patient Instructions (Signed)

## 2013-11-04 ENCOUNTER — Encounter: Payer: Self-pay | Admitting: Podiatry

## 2013-11-04 NOTE — Progress Notes (Signed)
Patient ID: Xavier Cook., male   DOB: Aug 16, 1940, 73 y.o.   MRN: [id number]  Subjective: This patient presents after I&D of paronychia on 10/11/2013. He had a holding prescription for cephalexin, however, did not fill it  Objective: Left hallux nail bed is dry and crusted without any erythema, edema or drainage  Assessment: Result paronychia left hallux Ingrowing left hallux toenail  Plan: Offered patient phenol matricectomy and he verbally consents  The left hallux was blockjed with 4 cc 50-50 mixture of 2% plain Xylocaine and 0.5% plain Marcaine. The left hallux is painted with Betadine and exsanguinated. The nailbed iwas debrided and any residual nail was excised and a phenol matricectomy performed. An antibiotic dressing was applied and the tourniquet was released. Spontaneous capillary filling time noted left hallux.  Patient tolerated procedure without any difficulty Postoperative oral reconstruction provided  Reappoint at patient's request

## 2013-12-10 ENCOUNTER — Other Ambulatory Visit: Payer: Self-pay

## 2013-12-18 ENCOUNTER — Telehealth: Payer: Self-pay

## 2013-12-18 NOTE — Telephone Encounter (Signed)
Invalid number in pt chart.

## 2014-01-07 ENCOUNTER — Other Ambulatory Visit: Payer: Self-pay | Admitting: Internal Medicine

## 2014-02-03 ENCOUNTER — Encounter (HOSPITAL_COMMUNITY): Payer: Self-pay | Admitting: Cardiology

## 2014-03-14 ENCOUNTER — Encounter: Payer: Self-pay | Admitting: Podiatry

## 2014-03-14 ENCOUNTER — Ambulatory Visit (INDEPENDENT_AMBULATORY_CARE_PROVIDER_SITE_OTHER): Payer: Medicare Other | Admitting: Podiatry

## 2014-03-14 VITALS — BP 154/81 | HR 68 | Resp 16

## 2014-03-14 DIAGNOSIS — L84 Corns and callosities: Secondary | ICD-10-CM

## 2014-03-14 DIAGNOSIS — M25561 Pain in right knee: Secondary | ICD-10-CM | POA: Diagnosis not present

## 2014-03-14 DIAGNOSIS — M47817 Spondylosis without myelopathy or radiculopathy, lumbosacral region: Secondary | ICD-10-CM | POA: Diagnosis not present

## 2014-03-14 DIAGNOSIS — M179 Osteoarthritis of knee, unspecified: Secondary | ICD-10-CM | POA: Diagnosis not present

## 2014-03-14 NOTE — Progress Notes (Signed)
Patient ID: Xavier Bottom., male   DOB: 1940-08-02, 74 y.o.   MRN: [id number]  Subjective: This patient presents after phenol matricectomy performed on 11/03/2013 left hallux nail. Is concerned about a crusted area on the dorsal aspect of the left hallux nail bed. He describes a drop on injury the left hallux and last week resulting from a shower.. He also relates a history of chronic edema associated with his antihypertensive medication  Objective: Orientated 3 Pitting edema bilaterally DP and PT pulses 2/4 bilaterally  Left hallux nail bed has dry eschar, keratoses in the mid section of the nailbed. There is no proximal nail growth noted at the left hallux interphalangeal joint there is eschar with low-grade erythema and edema associated with drop on injury  Assessment: Residual eschar/keratoses left hallux nail bed without regrowth Low-grade edema eschar associated with drop on injury left hallux  Plan: Advised patient there was no regrowth of the left hallux nail. I debrided the eschar/keratoses from the nailbed without a bleeding.  Reappoint when necessary at patient's request

## 2014-03-16 ENCOUNTER — Other Ambulatory Visit: Payer: Self-pay | Admitting: Anesthesiology

## 2014-03-16 DIAGNOSIS — M545 Low back pain: Secondary | ICD-10-CM

## 2014-03-30 ENCOUNTER — Ambulatory Visit
Admission: RE | Admit: 2014-03-30 | Discharge: 2014-03-30 | Disposition: A | Discharge status: Home / Self Care | Payer: Medicare Other | Source: Ambulatory Visit | Attending: Anesthesiology | Admitting: Anesthesiology

## 2014-03-30 DIAGNOSIS — M545 Low back pain: Secondary | ICD-10-CM

## 2014-03-30 DIAGNOSIS — M1711 Unilateral primary osteoarthritis, right knee: Secondary | ICD-10-CM | POA: Diagnosis not present

## 2014-03-30 DIAGNOSIS — M5126 Other intervertebral disc displacement, lumbar region: Secondary | ICD-10-CM | POA: Diagnosis not present

## 2014-03-30 DIAGNOSIS — M4806 Spinal stenosis, lumbar region: Secondary | ICD-10-CM | POA: Diagnosis not present

## 2014-03-30 DIAGNOSIS — M47817 Spondylosis without myelopathy or radiculopathy, lumbosacral region: Secondary | ICD-10-CM | POA: Diagnosis not present

## 2014-03-30 DIAGNOSIS — M5137 Other intervertebral disc degeneration, lumbosacral region: Secondary | ICD-10-CM | POA: Diagnosis not present

## 2014-03-30 MED ORDER — GADOBENATE DIMEGLUMINE 529 MG/ML IV SOLN
20.0000 mL | Freq: Once | INTRAVENOUS | Status: AC | PRN
  Administered 2014-03-30: 20 mL via INTRAVENOUS

## 2014-04-14 DIAGNOSIS — M1711 Unilateral primary osteoarthritis, right knee: Secondary | ICD-10-CM | POA: Diagnosis not present

## 2014-04-19 ENCOUNTER — Telehealth: Payer: Self-pay | Admitting: Internal Medicine

## 2014-04-19 MED ORDER — DOXAZOSIN MESYLATE 4 MG PO TABS: 4.0000 mg | ORAL_TABLET | Freq: Every day | ORAL | Status: AC

## 2014-04-19 NOTE — Telephone Encounter (Signed)
Medication refiiled

## 2014-04-19 NOTE — Telephone Encounter (Signed)
Pt request refill doxazosin (CARDURA) 4 MG tablet [facility] [address]  Pt lives in [place] and this is the only med he does not have enough of to get to his may appt.  Can you refill until then?

## 2014-07-01 ENCOUNTER — Ambulatory Visit: Payer: Medicare Other | Admitting: Family Medicine

## 2014-08-15 ENCOUNTER — Encounter: Payer: Self-pay | Admitting: Internal Medicine

## 2014-08-19 ENCOUNTER — Other Ambulatory Visit: Payer: Self-pay | Admitting: Internal Medicine

## 2014-08-22 ENCOUNTER — Other Ambulatory Visit: Payer: Self-pay

## 2014-09-16 ENCOUNTER — Encounter: Payer: Self-pay | Admitting: *Deleted

## 2014-09-20 ENCOUNTER — Other Ambulatory Visit: Payer: Self-pay | Admitting: Internal Medicine

## 2014-10-10 ENCOUNTER — Ambulatory Visit: Payer: Medicare Other | Admitting: Family Medicine

## 2014-10-18 ENCOUNTER — Encounter: Payer: Self-pay | Admitting: Internal Medicine

## 2016-04-11 ENCOUNTER — Encounter: Payer: Self-pay | Admitting: Internal Medicine

## 2017-04-24 ENCOUNTER — Ambulatory Visit (INDEPENDENT_AMBULATORY_CARE_PROVIDER_SITE_OTHER): Payer: Self-pay | Admitting: Orthopaedic Surgery

## 2017-04-30 ENCOUNTER — Ambulatory Visit (INDEPENDENT_AMBULATORY_CARE_PROVIDER_SITE_OTHER): Payer: Medicare Other | Admitting: Orthopaedic Surgery

## 2017-04-30 ENCOUNTER — Encounter (INDEPENDENT_AMBULATORY_CARE_PROVIDER_SITE_OTHER): Payer: Self-pay | Admitting: Orthopaedic Surgery

## 2017-04-30 DIAGNOSIS — M1712 Unilateral primary osteoarthritis, left knee: Secondary | ICD-10-CM

## 2017-04-30 DIAGNOSIS — G8929 Other chronic pain: Secondary | ICD-10-CM

## 2017-04-30 DIAGNOSIS — M25562 Pain in left knee: Secondary | ICD-10-CM | POA: Diagnosis not present

## 2017-04-30 MED ORDER — METHYLPREDNISOLONE ACETATE 40 MG/ML IJ SUSP
40.0000 mg | INTRAMUSCULAR | Status: AC | PRN
  Administered 2017-04-30: 40 mg via INTRA_ARTICULAR

## 2017-04-30 MED ORDER — LIDOCAINE HCL 1 % IJ SOLN
3.0000 mL | INTRAMUSCULAR | Status: AC | PRN
  Administered 2017-04-30: 3 mL

## 2017-04-30 NOTE — Progress Notes (Signed)
Office Visit Note   Patient: Xavier Cook.           Date of Birth: [birth year]           MRN: [id number] Visit Date: 04/30/2017              Requested by: Lisabeth Pick, MD [place], [place] [address] PCP: Lisabeth Pick, MD   Assessment & Plan: Visit Diagnoses:  1. Chronic pain of left knee   2. Unilateral primary osteoarthritis, left knee     Plan: We talked about trying a steroid injection in his left knee and he was agreeable to this.  This is mainly what he came for.  We talked about the risk and benefits of injections as well.  He tolerated the injection well without difficulties.  He will follow-up as needed.  Follow-Up Instructions: Return if symptoms worsen or fail to improve.   Orders:  Orders Placed This Encounter  Procedures  . Large Joint Inj   No orders of the defined types were placed in this encounter.     Procedures: Large Joint Inj: L knee on 04/30/2017 2:25 PM Indications: diagnostic evaluation and pain Details: 22 G 1.5 in needle, superolateral approach  Arthrogram: No  Medications: 3 mL lidocaine 1 %; 40 mg methylPREDNISolone acetate 40 MG/ML Outcome: tolerated well, no immediate complications Procedure, treatment alternatives, risks and benefits explained, specific risks discussed. Consent was given by the patient. Immediately prior to procedure a time out was called to verify the correct patient, procedure, equipment, support staff and site/side marked as required. Patient was prepped and draped in the usual sterile fashion.       Clinical Data: No additional findings.   Subjective: Chief Complaint  Patient presents with  . Left Knee - Follow-up  The patient comes in today for his left knee.  We performed a knee arthroscopy of that knee in 2013.  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  He says he would like to have a steroid injection in his knee because this was helped  the most.  Hyaluronic acid in the past is not helped much.  Although a series of 3 Euflexxa injections did.  He said no other acute changes in medical status.  His knee does not lock and catch and mainly just pain in the posterior medial joint line of his knee.  He also has pain along his left lateral hip and thigh area.  HPI  Review of Systems He currently denies any headache, chest pain, shortness of breath, fever, chills, nausea, vomiting.  Objective: Vital Signs: There were no vitals taken for this visit.  Physical Exam He is alert and oriented x3 and in no acute distress Ortho Exam Examination of his left knee shows significant varus malalignment.  There is a mild effusion.  He has good range of motion but patellofemoral crepitation and significant medial joint line tenderness.  He does have pain along the iliotibial band as well as the trochanteric area of his hip but his left hip moves smoothly without any pain in the groin at all. Specialty Comments:  No specialty comments available.  Imaging: No results found.   PMFS History: Patient Active Problem List   Diagnosis Date Noted  . Unilateral primary osteoarthritis, left knee 04/30/2017  . Chronic pain of left knee 04/30/2017  . Hyperglycemia 03/05/2013  . Obesity (BMI 30-39.9) 02/26/2013  . Normal coronary arteries  Feb 2014 02/26/2013  . RBBB 02/26/2013  . Metabolic syndrome 27/07/2374  . HYPERLIPIDEMIA 12/22/2006  . HYPERTENSION 12/22/2006  . GERD 12/22/2006  . COLONIC POLYPS, HX OF 12/22/2006   Past Medical History:  Diagnosis Date  . Back pain   . Hyperlipidemia   . Hypertension   . Personal history of colonic polyps   . Visual floaters    asteroid hyalosis    Family History  Problem Relation Age of Onset  . Stroke Mother 59  . Hypertension Mother   . Breast cancer Sister   . Colon cancer Neg Hx   . Sudden death Father 71  . Lung cancer Sister     Past Surgical History:  Procedure Laterality Date  .  CATARACT EXTRACTION, BILATERAL  2012  . CHOLECYSTECTOMY    . COLONOSCOPY    . KNEE CARTILAGE SURGERY     right knee  . KNEE SURGERY     left arthroscopy  . LEFT HEART CATHETERIZATION WITH CORONARY ANGIOGRAM N/A 04/20/2012   Procedure: LEFT HEART CATHETERIZATION WITH CORONARY ANGIOGRAM;  Surgeon: Leonie Man, MD;  Location: [facility] C[facility] LAB;  Service: Cardiovascular;  Laterality: N/A;  . LUMBAR DISC SURGERY     w/o myelo  . nerve blocks    . SIGMOIDOSCOPY    . tennis elbow surg    . TONSILLECTOMY AND ADENOIDECTOMY    . UPPER GASTROINTESTINAL ENDOSCOPY     Social History   Occupational History  . Not on file  Tobacco Use  . Smoking status: Former Smoker    Types: Cigarettes    Last attempt to quit: 10/26/1995    Years since quitting: 21.5  . Smokeless tobacco: Never Used  Substance and Sexual Activity  . Alcohol use: No  . Drug use: No  . Sexual activity: Not on file

## 2017-05-05 ENCOUNTER — Ambulatory Visit (INDEPENDENT_AMBULATORY_CARE_PROVIDER_SITE_OTHER): Payer: Self-pay | Admitting: Orthopaedic Surgery

## 2017-06-03 ENCOUNTER — Telehealth (INDEPENDENT_AMBULATORY_CARE_PROVIDER_SITE_OTHER): Payer: Self-pay

## 2017-06-03 ENCOUNTER — Telehealth (INDEPENDENT_AMBULATORY_CARE_PROVIDER_SITE_OTHER): Payer: Self-pay | Admitting: Orthopaedic Surgery

## 2017-06-03 NOTE — Telephone Encounter (Signed)
Faxed completed enrollment form for Euflexxa injection series, left knee to [phone].

## 2017-06-03 NOTE — Telephone Encounter (Signed)
See below

## 2017-06-03 NOTE — Telephone Encounter (Signed)
Please advise 

## 2017-06-03 NOTE — Telephone Encounter (Signed)
Please order Euflexxa for his left knee. Thanks

## 2017-06-03 NOTE — Telephone Encounter (Signed)
Patient called advised the cortisone injection did not work. Patient asked if he can get the injection that's administered 1 a week for 3 weeks. The number to contact patient is [phone]

## 2017-06-04 NOTE — Telephone Encounter (Signed)
Noted  

## 2017-06-24 ENCOUNTER — Telehealth (INDEPENDENT_AMBULATORY_CARE_PROVIDER_SITE_OTHER): Payer: Self-pay

## 2017-06-24 NOTE — Telephone Encounter (Signed)
Talked with Xavier Cook. At [facility] and he stated that [id number] Euflexxa injection for left knee does not require a PA.

## 2017-07-04 ENCOUNTER — Telehealth (INDEPENDENT_AMBULATORY_CARE_PROVIDER_SITE_OTHER): Payer: Self-pay

## 2017-07-04 ENCOUNTER — Encounter (INDEPENDENT_AMBULATORY_CARE_PROVIDER_SITE_OTHER): Payer: Self-pay

## 2017-07-04 NOTE — Telephone Encounter (Signed)
Tried calling patient to schedule appts.for Euflexxa injections, left knee, but patient's number is out of service. All 3 appts.must be scheduled before 07/26/2017, due to insurance plan requiring a Pre-Cert starting on 05/01/4825. Currently patient's insurance plan does not require a Pre-Cert.  Covered at 80% for Euflexxa injection series, Left Knee' [place] for 20% OOP                  $50.00 Co-pay each visit

## 2017-07-04 NOTE — Telephone Encounter (Signed)
Patient called in regards to injection, please give him a call back at [phone]

## 2017-07-04 NOTE — Telephone Encounter (Signed)
Tried calling patient to schedule appts.for Euflexxa injections, left knee, but patient's number is out of service. All 3 appts.must be scheduled before 07/26/2017, due to insurance plan requiring a Pre-Cert starting on 09/25/8588. Currently patient's insurance plan does not require a Pre-Cert.  Covered at 80% for Euflexxa injection series, Left Knee' [place] for 20% OOP   $50.00 Co-pay each visit

## 2017-07-04 NOTE — Telephone Encounter (Signed)
Message sent through mychart to patient concerning scheduling appointments.

## 2017-07-04 NOTE — Telephone Encounter (Signed)
Talked with [employer] representative to clarify VOB, due to being told by Ram P. With [employer] that no PA is needed.  Will call patient once I have received a returned call from a rep.with [employer].

## 2017-07-14 ENCOUNTER — Encounter (INDEPENDENT_AMBULATORY_CARE_PROVIDER_SITE_OTHER): Payer: Self-pay | Admitting: Orthopaedic Surgery

## 2017-07-14 ENCOUNTER — Ambulatory Visit (INDEPENDENT_AMBULATORY_CARE_PROVIDER_SITE_OTHER): Payer: Medicare Other | Admitting: Orthopaedic Surgery

## 2017-07-14 DIAGNOSIS — M1712 Unilateral primary osteoarthritis, left knee: Secondary | ICD-10-CM

## 2017-07-14 MED ORDER — SODIUM HYALURONATE (VISCOSUP) 20 MG/2ML IX SOSY
20.0000 mg | PREFILLED_SYRINGE | INTRA_ARTICULAR | Status: AC | PRN
  Administered 2017-07-14: 20 mg via INTRA_ARTICULAR

## 2017-07-14 NOTE — Progress Notes (Signed)
Slight  Procedure Note  Patient: Xavier Cook.             Date of Birth: Jun 16, 1940           MRN: [id number]             Visit Date: 07/14/2017  Procedures: Visit Diagnoses: No diagnosis found.  Large Joint Inj: L knee on 07/14/2017 2:45 PM Indications: diagnostic evaluation and pain Details: 22 G 1.5 in needle, superolateral approach  Arthrogram: No  Medications: 20 mg Sodium Hyaluronate 20 MG/2ML Outcome: tolerated well, no immediate complications Procedure, treatment alternatives, risks and benefits explained, specific risks discussed. Consent was given by the patient. Immediately prior to procedure a time out was called to verify the correct patient, procedure, equipment, support staff and site/side marked as required. Patient was prepped and draped in the usual sterile fashion.    The patient is here today for scheduled hyaluronic acid injection with Euflexxa in his left knee.  This is injection #1 of a series of 3 injections to treat the pain from moderate osteoarthritis.  He says his right knee is doing well.  His left knee is been painful.  On exam there is no effusion of his left knee with varus malalignment and painful range of motion that is full.  Tolerated injection #1 well.  Will come back next week for injection #2 of series of 3 injections of his left knee.

## 2017-07-23 ENCOUNTER — Encounter (INDEPENDENT_AMBULATORY_CARE_PROVIDER_SITE_OTHER): Payer: Self-pay | Admitting: Orthopaedic Surgery

## 2017-07-23 ENCOUNTER — Ambulatory Visit (INDEPENDENT_AMBULATORY_CARE_PROVIDER_SITE_OTHER): Payer: Medicare Other | Admitting: Orthopaedic Surgery

## 2017-07-23 DIAGNOSIS — M1712 Unilateral primary osteoarthritis, left knee: Secondary | ICD-10-CM | POA: Diagnosis not present

## 2017-07-23 MED ORDER — SODIUM HYALURONATE (VISCOSUP) 20 MG/2ML IX SOSY
20.0000 mg | PREFILLED_SYRINGE | INTRA_ARTICULAR | Status: AC | PRN
  Administered 2017-07-23: 20 mg via INTRA_ARTICULAR

## 2017-07-23 NOTE — Progress Notes (Signed)
   Procedure Note  Patient: Xavier Cook.             Date of Birth: 1940/12/16           MRN: [id number]             Visit Date: 07/23/2017  Procedures: Visit Diagnoses: Unilateral primary osteoarthritis, left knee  Large Joint Inj: L knee on 07/23/2017 3:20 PM Indications: diagnostic evaluation and pain Details: 22 G 1.5 in needle, superolateral approach  Arthrogram: No  Medications: 20 mg Sodium Hyaluronate 20 MG/2ML Outcome: tolerated well, no immediate complications Procedure, treatment alternatives, risks and benefits explained, specific risks discussed. Consent was given by the patient. Immediately prior to procedure a time out was called to verify the correct patient, procedure, equipment, support staff and site/side marked as required. Patient was prepped and draped in the usual sterile fashion.    The patient is here for injection #2 of the series of 3 Euflexxa injections in his left knee to treat moderate osteoarthritis pain.  He has done well with the first injection.  On exam there is no effusion of his left knee no adverse reaction to injection #1.  I placed an injection #2 into his left knee without difficulty of Euflexxa.  We will work on scheduling appointment for next week for injection #3.  Unfortunately this needs to be on Friday.  He drives 027 miles from the [place] for [place] to have this injection placed and that is the only he can come that week.  We will see about getting 1 of our other providers to place an injection at that visit.  I would then see him back at 3 months after that.  All question concerns were answered and addressed.

## 2017-08-01 ENCOUNTER — Ambulatory Visit (INDEPENDENT_AMBULATORY_CARE_PROVIDER_SITE_OTHER): Payer: Medicare Other | Admitting: Orthopaedic Surgery

## 2017-08-01 DIAGNOSIS — M1712 Unilateral primary osteoarthritis, left knee: Secondary | ICD-10-CM | POA: Diagnosis not present

## 2017-08-01 MED ORDER — SODIUM HYALURONATE (VISCOSUP) 20 MG/2ML IX SOSY
20.0000 mg | PREFILLED_SYRINGE | INTRA_ARTICULAR | Status: AC | PRN
  Administered 2017-08-01: 20 mg via INTRA_ARTICULAR

## 2017-08-01 NOTE — Progress Notes (Signed)
   Procedure Note  Patient: Xavier Cook.             Date of Birth: 1940-07-31           MRN: [id number]             Visit Date: 08/01/2017  Procedures: Visit Diagnoses: Unilateral primary osteoarthritis, left knee  Large Joint Inj: L knee on 08/01/2017 8:58 AM Details: 22 G needle Medications: 20 mg Sodium Hyaluronate 20 MG/2ML Outcome: tolerated well, no immediate complications Patient was prepped and draped in the usual sterile fashion.

## 2017-11-26 ENCOUNTER — Other Ambulatory Visit: Payer: Medicare Other
